# Patient Record
Sex: Female | Born: 1995 | Hispanic: Yes | Marital: Single | Smoking: Never smoker
Health system: Southern US, Community
[De-identification: ages and names within clinical notes are randomized; demographics above are authoritative.]

## PROBLEM LIST (undated history)

## (undated) DIAGNOSIS — L309 Dermatitis, unspecified: Secondary | ICD-10-CM

## (undated) DIAGNOSIS — T7840XA Allergy, unspecified, initial encounter: Secondary | ICD-10-CM

## (undated) DIAGNOSIS — G8929 Other chronic pain: Secondary | ICD-10-CM

## (undated) DIAGNOSIS — M25511 Pain in right shoulder: Secondary | ICD-10-CM

## (undated) DIAGNOSIS — M549 Dorsalgia, unspecified: Secondary | ICD-10-CM

## (undated) DIAGNOSIS — Z9109 Other allergy status, other than to drugs and biological substances: Secondary | ICD-10-CM

## (undated) HISTORY — DX: Pain in right shoulder: M25.511

## (undated) HISTORY — DX: Other chronic pain: G89.29

## (undated) HISTORY — DX: Allergy, unspecified, initial encounter: T78.40XA

---
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
## 2016-01-08 ENCOUNTER — Encounter: Payer: Medicaid Other | Admitting: Obstetrics and Gynecology

## 2016-01-09 ENCOUNTER — Encounter: Payer: Self-pay | Admitting: Obstetrics and Gynecology

## 2016-01-09 NOTE — Progress Notes (Signed)
Patient did not keep 01/08/2016 GYN visit  Cornelia Copaharlie Danicka Hourihan, Jr MD Attending Center for [facility](Faculty Practice)

## 2016-01-26 ENCOUNTER — Encounter (HOSPITAL_COMMUNITY): Payer: Self-pay | Admitting: Emergency Medicine

## 2016-01-26 ENCOUNTER — Emergency Department (HOSPITAL_COMMUNITY)
Admission: EM | Admit: 2016-01-26 | Discharge: 2016-01-26 | Disposition: A | Discharge status: Home / Self Care | Payer: Medicaid Other | Attending: Emergency Medicine | Admitting: Emergency Medicine

## 2016-01-26 DIAGNOSIS — L309 Dermatitis, unspecified: Secondary | ICD-10-CM | POA: Diagnosis not present

## 2016-01-26 DIAGNOSIS — R21 Rash and other nonspecific skin eruption: Secondary | ICD-10-CM | POA: Diagnosis present

## 2016-01-26 DIAGNOSIS — Z79899 Other long term (current) drug therapy: Secondary | ICD-10-CM | POA: Diagnosis not present

## 2016-01-26 HISTORY — DX: Dermatitis, unspecified: L30.9

## 2016-01-26 MED ORDER — PREDNISONE 10 MG PO TABS: 20.0000 mg | ORAL_TABLET | Freq: Two times a day (BID) | ORAL | 0 refills | Status: DC

## 2016-01-26 MED ORDER — PREDNISONE 20 MG PO TABS
60.0000 mg | ORAL_TABLET | Freq: Once | ORAL | Status: AC
  Administered 2016-01-26: 60 mg via ORAL
  Filled 2016-01-26: qty 3

## 2016-01-26 NOTE — ED Triage Notes (Signed)
Patient states that at work earlier she got red on face, neck, scalp and arms that got really "itchy".  Patient denies any new lotions, soaps, shampoos, deodarants.

## 2016-01-26 NOTE — ED Notes (Signed)
Pt reports hx of eczema worse with change in foods. Pt reports usually happens with large intake of coffee. Reports two cups of coffee yesterday and "then this happened." Pt reports onset 1400 today. Denies SOB, speaking full sentences, lungs sounds clear.

## 2016-01-26 NOTE — Discharge Instructions (Signed)
Use the prednisone as prescribed.  Also, for itching, use Benadryl 4 times a day and Pepcid twice a day.  Follow-up with your primary care doctor for checkup. If not better in 3 or 4 days.

## 2016-01-26 NOTE — ED Provider Notes (Signed)
[facility]-EMERGENCY DEPT Provider Note   CSN: [id number] Arrival date & time: 01/26/16  1746     History   Chief Complaint Chief Complaint  Patient presents with  . Rash    HPI Kristen Henderson is a 20 y.o. female. She complains of itchy rash typical of her usual outbreaks of eczema, face, both arms and right hand. It got suddenly worse today, and she suspects that she ate something that caused it. She denies shortness of breath, fever, chills, chest pain, cough, weakness or dizziness. She tried Benadryl today, without relief so came here. There are no other known modifying factors.  HPI  Past Medical History:  Diagnosis Date  . Back pain   . Eczema   . Environmental allergies     There are no active problems to display for this patient.   History reviewed. No pertinent surgical history.  OB History    No data available       Home Medications    Prior to Admission medications   Medication Sig Start Date End Date Taking? Authorizing Provider  acetaminophen (TYLENOL) 500 MG tablet Take 1,000 mg by mouth every 6 (six) hours as needed for moderate pain or headache.    Historical Provider, MD  benzonatate (TESSALON) 100 MG capsule Take 1 capsule (100 mg total) by mouth 3 (three) times daily as needed for cough. 06/28/15   Everlene FarrierWilliam Dansie, PA-C  CALCIUM PO Take 1 tablet by mouth daily.    Historical Provider, MD  cetirizine (ZYRTEC ALLERGY) 10 MG tablet Take 1 tablet (10 mg total) by mouth daily. 06/28/15   Everlene FarrierWilliam Dansie, PA-C  diphenhydrAMINE (BENADRYL) 25 MG tablet Take 1 tablet (25 mg total) by mouth every 6 (six) hours as needed for itching (Rash). 06/07/14   Antony MaduraKelly Humes, PA-C  famotidine (PEPCID) 20 MG tablet Take 1 tablet (20 mg total) by mouth 2 (two) times daily. 06/07/14   Antony MaduraKelly Humes, PA-C  Multiple Vitamin (MULTIVITAMIN WITH MINERALS) TABS tablet Take 2 tablets by mouth daily.    Historical Provider, MD  naproxen (NAPROSYN) 250 MG tablet Take 1 tablet (250 mg  total) by mouth 2 (two) times daily with a meal. 06/28/15   Everlene FarrierWilliam Dansie, PA-C    Family History No family history on file.  Social History Social History  Substance Use Topics  . Smoking status: Never Smoker  . Smokeless tobacco: Never Used  . Alcohol use No     Allergies   Review of patient's allergies indicates no known allergies.   Review of Systems Review of Systems  All other systems reviewed and are negative.    Physical Exam Updated Vital Signs BP 112/61 (BP Location: Left Arm)   Pulse 79   Temp 98.3 F (36.8 C) (Oral)   Resp 17   SpO2 100%   Physical Exam  Constitutional: She is oriented to person, place, and time. She appears well-developed and well-nourished.  HENT:  Head: Normocephalic and atraumatic.  Eyes: Conjunctivae and EOM are normal. Pupils are equal, round, and reactive to light.  Neck: Normal range of motion and phonation normal. Neck supple.  Cardiovascular: Normal rate.   Pulmonary/Chest: Effort normal. She exhibits no tenderness.  Musculoskeletal: Normal range of motion.  Neurological: She is alert and oriented to person, place, and time. She exhibits normal muscle tone.  Skin: Skin is warm and dry. There is erythema (Scattered red, flat areas of bilateral cheeks, anterior neck, and an area, plaque-like of the right dorsal hand).  Psychiatric: She  has a normal mood and affect. Her behavior is normal. Judgment and thought content normal.  Nursing note and vitals reviewed.    ED Treatments / Results  Labs (all labs ordered are listed, but only abnormal results are displayed) Labs Reviewed - No data to display  EKG  EKG Interpretation None       Radiology No results found.  Procedures Procedures (including critical care time)  Medications Ordered in ED Medications - No data to display   Initial Impression / Assessment and Plan / ED Course  I have reviewed the triage vital signs and the nursing notes.  Pertinent labs &  imaging results that were available during my care of the patient were reviewed by me and considered in my medical decision making (see chart for details).  Clinical Course    Medications  predniSONE (DELTASONE) tablet 60 mg (not administered)    Patient Vitals for the past 24 hrs:  BP Temp Temp src Pulse Resp SpO2  01/26/16 1758 112/61 98.3 F (36.8 C) Oral 79 17 100 %    6:19 PM Reevaluation with update and discussion. After initial assessment and treatment, an updated evaluation reveals No change in clinical status. Findings discussed with patient. No questions answered. Kristen Henderson    Final Clinical Impressions(s) / ED Diagnoses   Final diagnoses:  Eczema, unspecified type    Nonspecific rash, likely eczema. Doubt anaphylaxis, or serious allergic reaction  Nursing Notes Reviewed/ Care Coordinated Applicable Imaging Reviewed Interpretation of Laboratory Data incorporated into ED treatment  The patient appears reasonably screened and/or stabilized for discharge and I doubt any other medical condition or other [facility] requiring further screening, evaluation, or treatment in the ED at this time prior to discharge.  Plan: Home Medications- continue; Home Treatments- rest; return here if the recommended treatment, does not improve the symptoms; Recommended follow up- PCP prn    New Prescriptions New Prescriptions   No medications on file     Mancel Bale, MD 01/26/16 1824

## 2016-03-16 ENCOUNTER — Encounter: Payer: Self-pay | Admitting: Allergy and Immunology

## 2016-03-16 ENCOUNTER — Ambulatory Visit (INDEPENDENT_AMBULATORY_CARE_PROVIDER_SITE_OTHER): Payer: Medicaid Other | Admitting: Allergy and Immunology

## 2016-03-16 VITALS — BP 110/72 | HR 72 | Temp 97.7°F | Resp 18 | Ht 63.0 in | Wt 165.0 lb

## 2016-03-16 DIAGNOSIS — J3089 Other allergic rhinitis: Secondary | ICD-10-CM | POA: Diagnosis not present

## 2016-03-16 DIAGNOSIS — L2089 Other atopic dermatitis: Secondary | ICD-10-CM | POA: Diagnosis not present

## 2016-03-16 MED ORDER — METHYLPREDNISOLONE ACETATE 80 MG/ML IJ SUSP
80.0000 mg | Freq: Once | INTRAMUSCULAR | Status: AC
  Administered 2016-03-16: 80 mg via INTRAMUSCULAR

## 2016-03-16 MED ORDER — MONTELUKAST SODIUM 10 MG PO TABS: 10.0000 mg | ORAL_TABLET | Freq: Every day | ORAL | 5 refills | Status: DC

## 2016-03-16 MED ORDER — FLUTICASONE PROPIONATE 50 MCG/ACT NA SUSP: 1.0000 | Freq: Every day | NASAL | 5 refills | Status: DC

## 2016-03-16 MED ORDER — PIMECROLIMUS 1 % EX CREA: TOPICAL_CREAM | Freq: Two times a day (BID) | CUTANEOUS | 5 refills | Status: DC

## 2016-03-16 MED ORDER — MOMETASONE FUROATE 0.1 % EX OINT: TOPICAL_OINTMENT | Freq: Every day | CUTANEOUS | 0 refills | Status: DC

## 2016-03-16 NOTE — Progress Notes (Signed)
Dear Dr. Sabino Dick,  Thank you for referring Kristen Henderson to the [facility] [place] on 03/16/2016.   Below is a summation of this patient's evaluation and recommendations.  Thank you for your referral. I will keep you informed about this patient's response to treatment.   If you have any questions please do not hesitate to contact me.   Sincerely,  Jessica Priest, MD [facility]   ______________________________________________________________________    NEW PATIENT NOTE  Referring Provider: Christel Mormon, MD Primary Provider: No PCP Per Patient Date of office visit: 03/16/2016    Subjective:   Chief Complaint:  Kristen Henderson (DOB: 1995-12-13) is a 20 y.o. female who presents to the clinic on 03/16/2016 with a chief complaint of Allergic Reaction; Angioedema (eye swelling); Urticaria; and Eczema .     HPI: Kristen Henderson presents to this clinic in evaluation of atopic disease involving 2 main organ systems.  Kristen Henderson has had lifelong eczema that has been progressive over the course of the past several years. Her eczema usually involves her extremities and her neck and her face. Kristen Henderson has seen a dermatologist and has had a check blood which apparently has been "normal". Kristen Henderson has been treated with multiple topical agents including topical steroids and topical Eucrisa which are ineffective in alleviating her situation. Kristen Henderson has required two systemic steroids in the past several years. There is no obvious provoking factor giving rise to her eczema. Since June Kristen Henderson's had a very significant involvement of her face with periorbital swelling and severe scaling of the skin of her face. Once again there is no obvious provoking factor giving rise to this increased facial activity.  Past Medical History:  Diagnosis Date  . Back pain   . Eczema   . Environmental allergies     No past surgical history  on file.    Medication List      diphenhydrAMINE 25 MG tablet Commonly known as:  BENADRYL Take 1 tablet (25 mg total) by mouth every 6 (six) hours as needed for itching (Rash).   loratadine 10 MG tablet Commonly known as:  CLARITIN Take 10 mg by mouth daily.       No Known Allergies  Review of systems negative except as noted in HPI / PMHx or noted below:  Review of Systems  Constitutional: Negative.   HENT: Negative.   Eyes: Negative.   Respiratory: Negative.   Cardiovascular: Negative.   Gastrointestinal: Negative.   Genitourinary: Negative.   Musculoskeletal: Negative.   Skin: Negative.   Neurological: Negative.   Endo/Heme/Allergies: Negative.   Psychiatric/Behavioral: Negative.     Family History  Problem Relation Age of Onset  . Allergic rhinitis Brother   . Asthma Brother   . Eczema Brother   . Diabetes Paternal Grandmother     Social History   Social History  . Marital status: Single    Spouse name: N/A  . Number of children: N/A  . Years of education: N/A   Occupational History  . Not on file.   Social History Main Topics  . Smoking status: Never Smoker  . Smokeless tobacco: Never Used  . Alcohol use No  . Drug use: No  . Sexual activity: Not on file   Other Topics Concern  . Not on file   Social History Narrative  . No narrative on file    Environmental and Social history  Lives in  a house with a dry environment, a bird located inside the household, carpeting in the bedroom, no plastic on the bed or pillow, and no smokers located inside the household. 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  Allergic shiners, rubbing of skin and nose  HENT:  Head: Normocephalic. Head is without right periorbital erythema and  without left periorbital erythema.  Right Ear: Tympanic membrane, external ear and ear canal normal.  Left Ear: Tympanic membrane, external ear and ear canal normal.  Nose: Mucosal edema present. No rhinorrhea.  Mouth/Throat: Oropharynx is clear and moist and mucous membranes are normal. No oropharyngeal exudate.  Eyes: Conjunctivae and lids are normal. Pupils are equal, round, and reactive to light.  Neck: Trachea normal. No tracheal deviation present. No thyromegaly present.  Cardiovascular: Normal rate, regular rhythm, S1 normal, S2 normal and normal heart sounds.   No murmur heard. Pulmonary/Chest: Effort normal. No stridor. No tachypnea. No respiratory distress. Kristen Henderson has no wheezes. Kristen Henderson has no rales. Kristen Henderson exhibits no tenderness.  Abdominal: Soft. Kristen Henderson exhibits no distension and no mass. There is no hepatosplenomegaly. There is no tenderness. There is no rebound and no guarding.  Musculoskeletal: Kristen Henderson exhibits no edema or tenderness.  Lymphadenopathy:       Head (right side): No tonsillar adenopathy present.       Head (left side): No tonsillar adenopathy present.    Kristen Henderson has no cervical adenopathy.    Kristen Henderson has no axillary adenopathy.  Neurological: Kristen Henderson is alert. Gait normal.  Skin: Rash (Large lichenified erythematous plaques involving extremities especially antecubital fossa bilaterally, hands, posterior neck, and face) noted. Kristen Henderson is not diaphoretic. No erythema. No pallor. Nails show no clubbing.  Psychiatric: Mood and affect normal.    Diagnostics: Allergy skin tests were not performed secondary to the recent administration of an antihistamine.   Results of blood tests forwarded by Dr. Sabino Dick dated 02/05/2016 refers to an IgE level of 597 KU/ML with IgE antibodies directed against fungi, dust mite, cockroach, and slight hypersensitivity directed against dog and shrimp.  Assessment and Plan:    1. Other atopic dermatitis   2. Other allergic rhinitis     1. Return to clinic in 2  weeks without antihistamine use for skin testing  2. Depo-Medrol 80 IM delivered in clinic today  3. Use a combination of Elidel followed by mometasone 0.1% ointment applied to eczema twice a day. Best if applied after water exposure.  4. Use Flonase one spray each nostril one time per day  5. Use montelukast 10 mg tablet 1 time per day  6. Can continue Claritin and Benadryl as needed  7. Submit for dupilumab administration  8. Obtain a flu vaccine this fall.  Kristen Henderson has failed medical therapy regarding her atopic disease and I will now see if we can get her to start dupilumab to treat her atopic dermatitis. If Kristen Henderson has a good response to dupilumab and Kristen Henderson should have no activity of her skin the Kristen Henderson may not require any medicines other than the administration of this injection every month. As well, this will probably take care of her allergic rhinitis. For today I will give her systemic steroid and have her use a combination of a calcineurin inhibitor and  a topical steroid as noted above. Kristen Henderson will probably need to use a little topical steroid on her face until things get under control and then Kristen Henderson can just rely on the use of her calcineurin inhibitor. We'll get her back in this clinic in a few weeks to define her aeroallergens hypersensitivity profile and provide her with allergen avoidance measures and possibly consider a course immunotherapy.  Jessica Priest, MD [facility] [place]

## 2016-03-16 NOTE — Patient Instructions (Addendum)
  1. Return to clinic in 2 weeks without antihistamine use for skin testing  2. Depo-Medrol 80 IM delivered in clinic today  3. Use a combination of Elidel followed by mometasone 0.1% ointment applied to eczema twice a day. Best if applied after water exposure.  4. Use Flonase one spray each nostril one time per day  5. Use montelukast 10 mg tablet 1 time per day  6. Can continue Claritin and Benadryl as needed  7. Submit for dupilumab administration  8. Obtain a flu vaccine this fall.

## 2016-04-27 ENCOUNTER — Encounter (INDEPENDENT_AMBULATORY_CARE_PROVIDER_SITE_OTHER): Payer: Self-pay

## 2016-04-27 ENCOUNTER — Encounter: Payer: Self-pay | Admitting: Allergy and Immunology

## 2016-04-27 ENCOUNTER — Ambulatory Visit (INDEPENDENT_AMBULATORY_CARE_PROVIDER_SITE_OTHER): Payer: Medicaid Other | Admitting: Allergy and Immunology

## 2016-04-27 VITALS — BP 108/82 | HR 64 | Resp 16

## 2016-04-27 DIAGNOSIS — J3089 Other allergic rhinitis: Secondary | ICD-10-CM | POA: Diagnosis not present

## 2016-04-27 DIAGNOSIS — L2089 Other atopic dermatitis: Secondary | ICD-10-CM

## 2016-04-27 NOTE — Progress Notes (Signed)
Follow-up Note  Referring Provider: No ref. provider found Primary Provider: Christel MormonOCCARO,PETER J, MD Date of Office Visit: 04/27/2016  Subjective:   Kristen Henderson (DOB: 08/13/1995) is a 21 y.o. female who [facility] on 04[facility]27/2016 in re-evaluation of the following:  HPI: Kristen Henderson returns to this clinic in reevaluation of her severe atopic dermatitis and allergic rhinitis. When I initially saw her in his clinic on 03/16/2016 we gave her systemic steroid and asked her to use topical anti-inflammatory agents for her skin and therapy directed against her atopic upper airway disease.  As expected she did quite well with the administration of systemic steroids and her other therapy but has slowly been redeveloping significant issues with her eczema as time his move forward.   There was some confusion regarding the logistics of obtaining her dupilumab. She had been living in [place] for a period in time and although the medication is available for her to start she has not yet contacted the company for delivery of this biological agent.  Her nose has not really been causing her much of a problem at all.  Allergies as of 04/27/2016   No Known Allergies     Medication List      diphenhydrAMINE 25 MG tablet Commonly known as:  BENADRYL Take 1 tablet (25 mg total) by mouth every 6 (six) hours as needed for itching (Rash).   fluticasone 50 MCG/ACT nasal spray Commonly known as:  FLONASE Place 1 spray into both nostrils daily.   loratadine 10 MG tablet Commonly known as:  CLARITIN Take 10 mg by mouth daily.   mometasone 0.1 % ointment Commonly known as:  ELOCON Apply topically daily.   montelukast 10 MG tablet Commonly known as:  SINGULAIR Take 1 tablet (10 mg total) by mouth at bedtime.   pimecrolimus 1 % cream Commonly known as:  ELIDEL Apply topically 2 (two) times daily.       Past Medical History:  Diagnosis Date  . Back pain   .  Eczema   . Environmental allergies     History reviewed. No pertinent surgical history.  Review of systems negative except as noted in HPI / PMHx or noted below:  Review of Systems  Constitutional: Negative.   HENT: Negative.   Eyes: Negative.   Respiratory: Negative.   Cardiovascular: Negative.   Gastrointestinal: Negative.   Genitourinary: Negative.   Musculoskeletal: Negative.   Skin: Negative.   Neurological: Negative.   Endo/Heme/Allergies: Negative.   Psychiatric/Behavioral: Negative.      Objective:   Vitals:   04/27/16 1340  BP: 108/82  Pulse: 64  Resp: 16          Physical Exam  Skin: Rash (multiple scaly lichenified erythematous plaques involving posterior neck, antecubital fossa, dorsal surfaces of hands, periorbital and perioral surfaces.) noted.    Diagnostics: Allergy skin testing was attempted but she had a negative histamine control most likely secondary to antihistamine use several days ago.  Assessment and Plan:   1. Other atopic dermatitis   2. Other allergic rhinitis     1. Allergen avoidance measures  2. Use a combination of Elidel followed by mometasone 0.1% ointment applied to eczema twice a day. Best if applied after water exposure.  3. Use Flonase one spray each nostril one time per day  4. Use montelukast 10 mg tablet 1 time per day  5. Can continue Claritin and Benadryl as needed  6. Contact company for dupilumab  delivery.  7. Return in 12 weeks  I think that Maitri's going to do much better when she starts on her dupilumab. We'll give her a total of 12 weeks of therapy and make an assessment of her response at that point in time. She will also perform allergen avoidance measures based upon her previous skin test results and continue to use anti-inflammatory agents for both her skin and airway at this point in time.  Laurette Schimke, MD [facility]

## 2016-04-27 NOTE — Patient Instructions (Addendum)
  1. Allergen avoidance measures  2. Use a combination of Elidel followed by mometasone 0.1% ointment applied to eczema twice a day. Best if applied after water exposure.  3. Use Flonase one spray each nostril one time per day  4. Use montelukast 10 mg tablet 1 time per day  5. Can continue Claritin and Benadryl as needed  6. Contact company for dupilumab delivery.  7. Return in 12 weeks

## 2016-04-30 ENCOUNTER — Ambulatory Visit (INDEPENDENT_AMBULATORY_CARE_PROVIDER_SITE_OTHER): Payer: Medicaid Other

## 2016-04-30 DIAGNOSIS — L2089 Other atopic dermatitis: Secondary | ICD-10-CM

## 2016-04-30 DIAGNOSIS — L209 Atopic dermatitis, unspecified: Secondary | ICD-10-CM | POA: Diagnosis not present

## 2016-04-30 NOTE — Progress Notes (Signed)
The patient and her brother were both taught on how to give dupilumab injections in her abdomen. The patient's brother showed proficiency on how to give the injection in her abdomen. The patient was advised that she will receive one injection after the loading dose. Advised pt to wait for 1 hour. Pt left right after injection.  Lot: [id number] Exp: 05/2017  Injection site: LLQ and RLQ of the abdomen.

## 2016-07-20 ENCOUNTER — Ambulatory Visit: Payer: Medicaid Other | Admitting: Allergy and Immunology

## 2016-10-27 IMAGING — CR DG CHEST 2V
2 series · 2 of 2 positions shown · non-contrast
Comparison: 12/05/2012

CLINICAL DATA: Productive cough, chills, fevers, headache for 4
days.

EXAM:
CHEST  2 VIEW

[w chest pa]
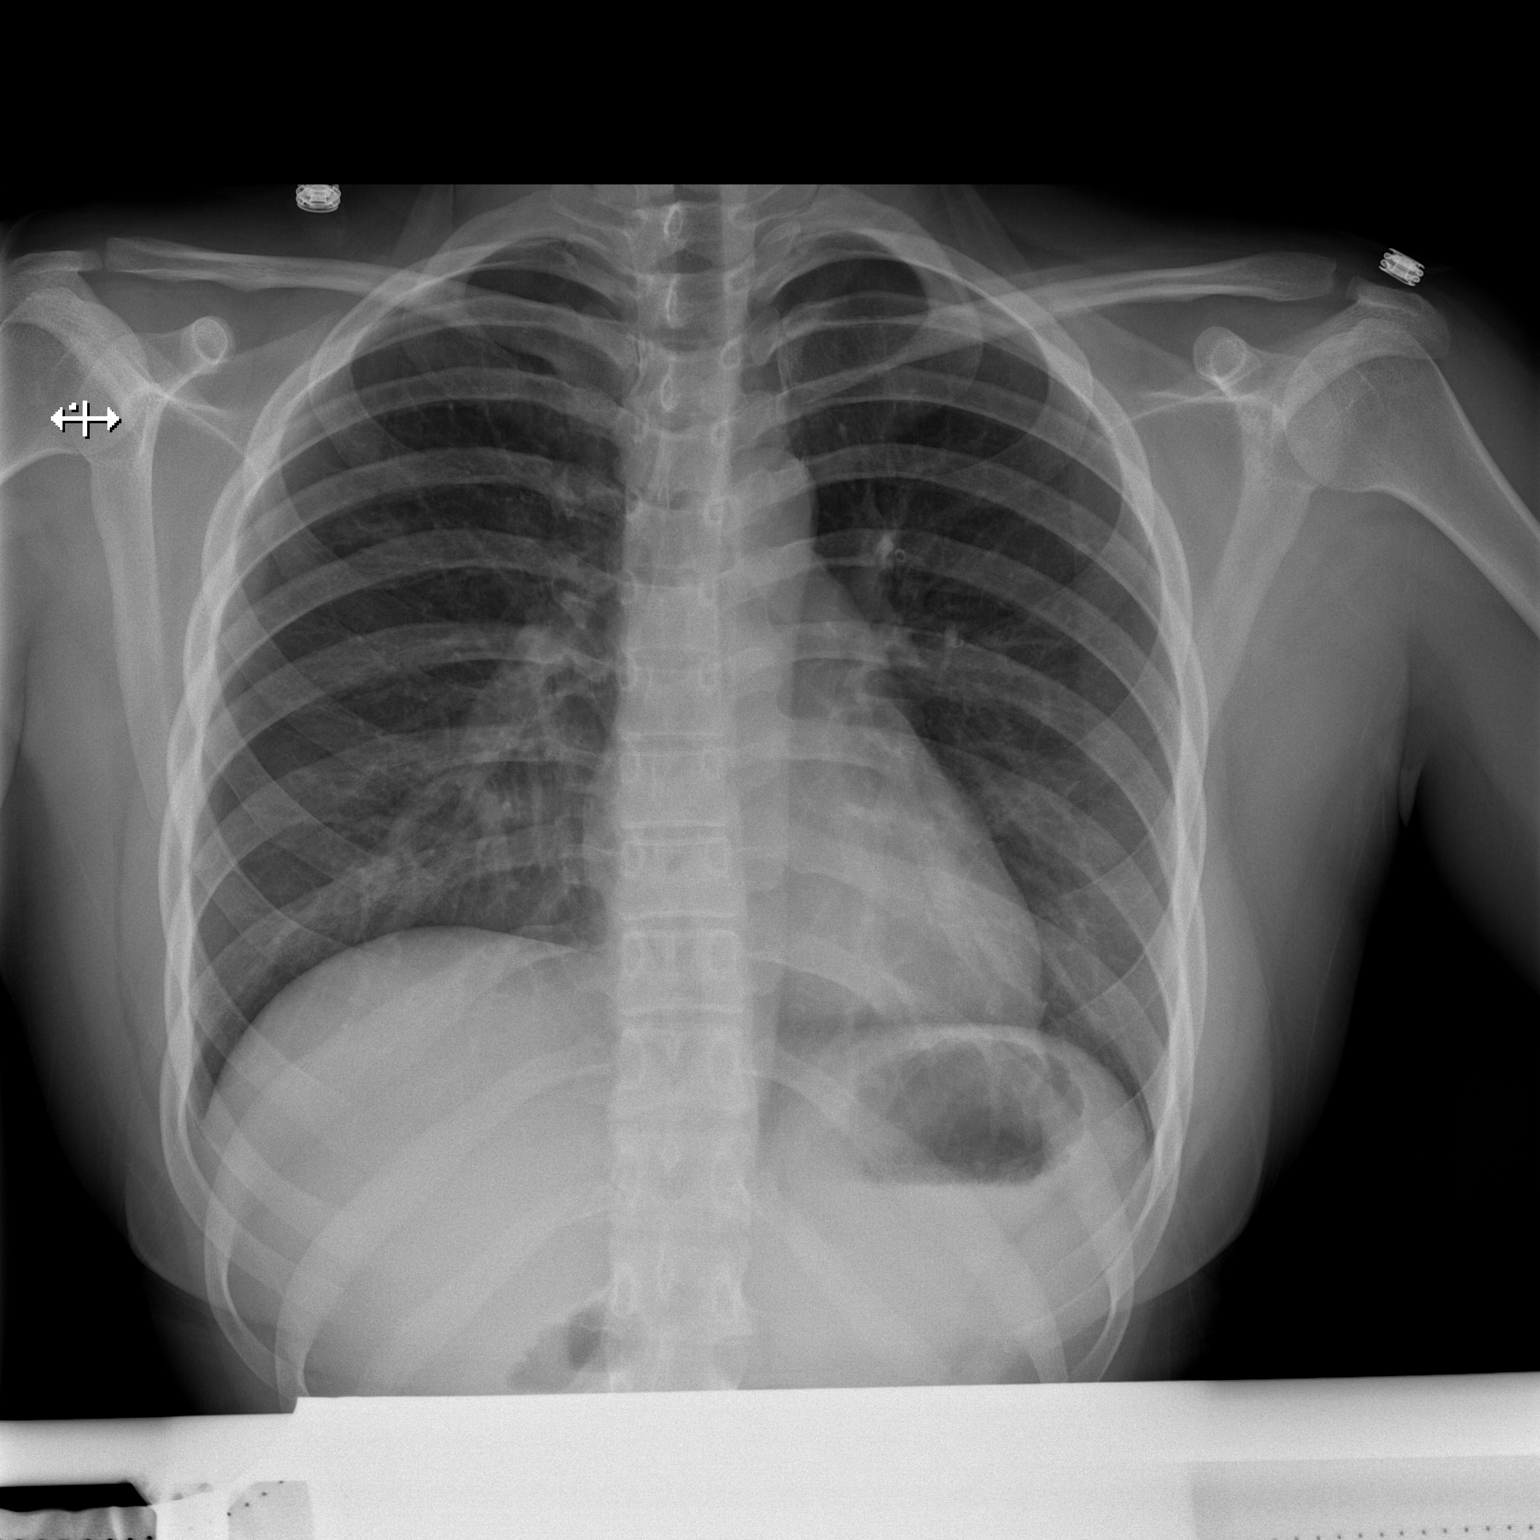

[w chest lat]
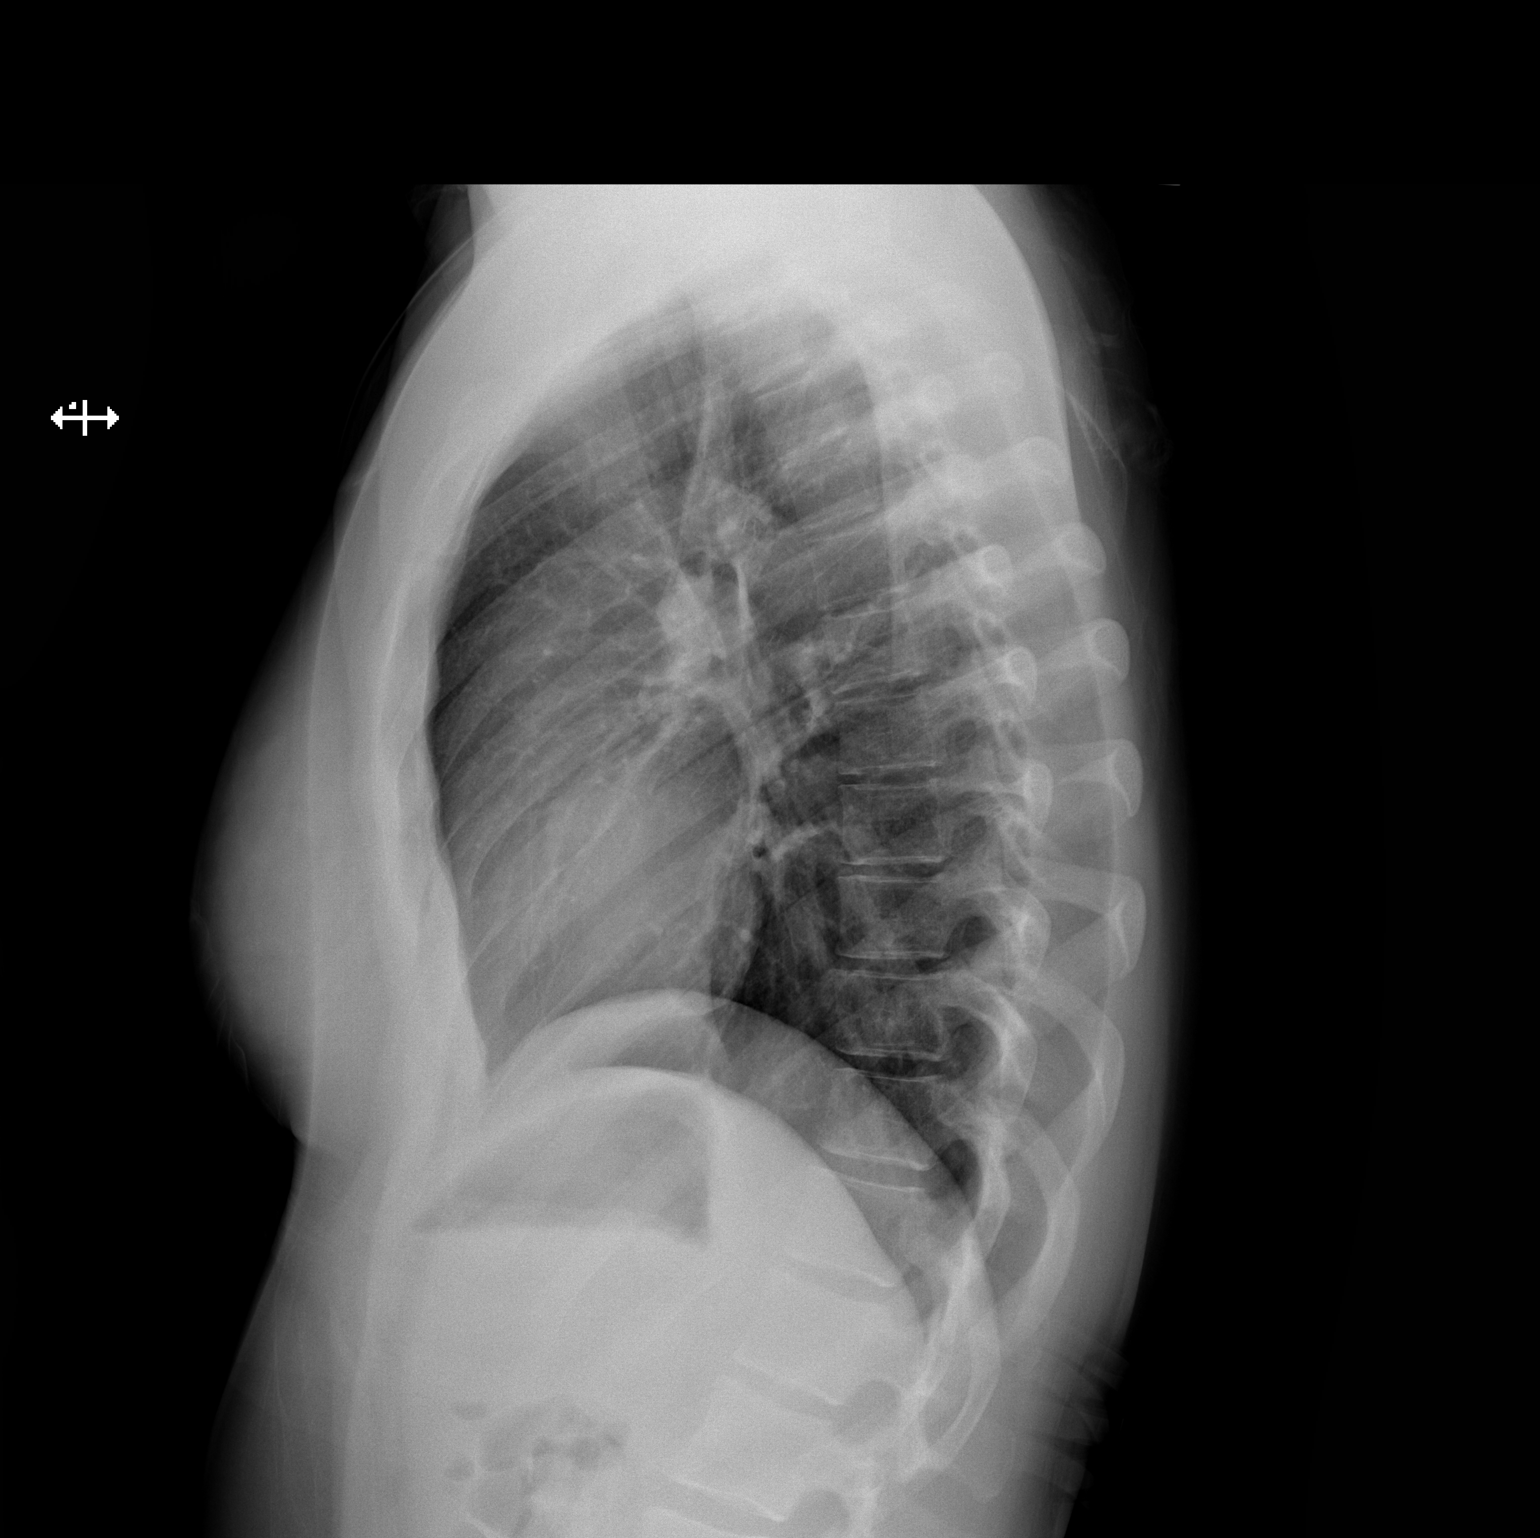

[2 of 2 positions shown; findings below may reference images not displayed]

FINDINGS: The cardiomediastinal contours are normal. The lungs are clear.
Pulmonary vasculature is normal. No consolidation, pleural effusion,
or pneumothorax. No acute osseous abnormalities are seen.
IMPRESSION: No acute pulmonary process.

## 2017-06-09 DIAGNOSIS — R112 Nausea with vomiting, unspecified: Secondary | ICD-10-CM | POA: Diagnosis not present

## 2017-06-09 DIAGNOSIS — B349 Viral infection, unspecified: Secondary | ICD-10-CM | POA: Diagnosis not present

## 2017-07-29 ENCOUNTER — Ambulatory Visit (HOSPITAL_BASED_OUTPATIENT_CLINIC_OR_DEPARTMENT_OTHER)
Admission: RE | Admit: 2017-07-29 | Discharge: 2017-07-29 | Disposition: A | Discharge status: Home / Self Care | Payer: BLUE CROSS/BLUE SHIELD | Source: Ambulatory Visit | Attending: Medical | Admitting: Medical

## 2017-07-29 ENCOUNTER — Encounter: Payer: Self-pay | Admitting: Medical

## 2017-07-29 ENCOUNTER — Ambulatory Visit: Payer: BLUE CROSS/BLUE SHIELD | Admitting: Medical

## 2017-07-29 ENCOUNTER — Telehealth: Payer: Self-pay

## 2017-07-29 VITALS — BP 105/57 | HR 63 | Temp 98.1°F | Resp 16 | Ht 63.0 in | Wt 169.2 lb

## 2017-07-29 DIAGNOSIS — J301 Allergic rhinitis due to pollen: Secondary | ICD-10-CM

## 2017-07-29 DIAGNOSIS — M26621 Arthralgia of right temporomandibular joint: Secondary | ICD-10-CM

## 2017-07-29 DIAGNOSIS — L309 Dermatitis, unspecified: Secondary | ICD-10-CM | POA: Diagnosis not present

## 2017-07-29 DIAGNOSIS — G8929 Other chronic pain: Secondary | ICD-10-CM

## 2017-07-29 DIAGNOSIS — M25511 Pain in right shoulder: Secondary | ICD-10-CM

## 2017-07-29 MED ORDER — PIMECROLIMUS 1 % EX CREA: TOPICAL_CREAM | Freq: Two times a day (BID) | CUTANEOUS | 0 refills | Status: DC

## 2017-07-29 MED ORDER — PREDNISONE 10 MG PO TABS: ORAL_TABLET | ORAL | 0 refills | Status: DC

## 2017-07-29 MED ORDER — MONTELUKAST SODIUM 10 MG PO TABS: 10.0000 mg | ORAL_TABLET | Freq: Every day | ORAL | 3 refills | Status: DC

## 2017-07-29 MED ORDER — DICLOFENAC SODIUM 75 MG PO TBEC
75.0000 mg | DELAYED_RELEASE_TABLET | Freq: Two times a day (BID) | ORAL | 0 refills | Status: DC
Start: 2017-07-29 — End: 2017-09-26

## 2017-07-29 MED ORDER — FLUTICASONE PROPIONATE 50 MCG/ACT NA SUSP: 2.0000 | Freq: Every day | NASAL | 11 refills | Status: DC

## 2017-07-29 MED ORDER — LEVOCETIRIZINE DIHYDROCHLORIDE 5 MG PO TABS: 5.0000 mg | ORAL_TABLET | Freq: Every evening | ORAL | 3 refills | Status: DC

## 2017-07-29 NOTE — Patient Instructions (Signed)
For seasonal allergies. Rx xyzal, flonase and singulair. 5 day taper dose prednisone due to severity.  For eczema use aveeno twice daily and dove moisturizing soap.  Will try elidel cream twice daily. But not to get place in eye. Caution on upper eyelid application. Do recommend dermatologist referral.   For shoulder pain, start diclofenac in 6 days. After prednisone use. Refer to sport [facility]. Get xray today.  Conservative measure tmj. Recommend see dentist. When get xrays may get view of tm joint.  Follow up in 2 weeks or as needed
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
## 2017-07-29 NOTE — Telephone Encounter (Signed)
PA initiated via Covermymeds; KEY: [id number]. Received real time approval.   Effective from 07/29/2017 through 07/28/2018.

## 2017-08-04 ENCOUNTER — Ambulatory Visit: Payer: BLUE CROSS/BLUE SHIELD | Admitting: Family Medicine

## 2017-08-09 ENCOUNTER — Telehealth: Payer: Self-pay | Admitting: Medical

## 2017-08-09 NOTE — Telephone Encounter (Signed)
If Dr. Carmelia RollerWendling willing to do removal procedure then ok. Can you run it by him.

## 2017-08-09 NOTE — Telephone Encounter (Signed)
Copied from CRM [phone]#[id number]. Topic: Inquiry >> Aug 09, 2017  1:49 PM Alexander BergeronBarksdale, Harvey B wrote: Reason for CRM: pt called and asked about getting a pregnancy implant removed in her arm, pt is needing to have this request to be reviewed by Dr. Carmelia RollerWendling to see if he can do it or to have the pt to go back to the original facility [facility] the [facility] and have them do it, contact pt to advise

## 2017-08-09 NOTE — Telephone Encounter (Signed)
Dr Carmelia RollerWendling-- please see below and advise?

## 2017-08-10 NOTE — Telephone Encounter (Signed)
I can do it. TY.

## 2017-08-10 NOTE — Telephone Encounter (Signed)
Needs to be scheduled with Dr. Carmelia RollerWendling for removal of nexplanon

## 2017-08-11 NOTE — Telephone Encounter (Signed)
Called pt to try and schedule an appt for the nexplanon removal. Pt does not have a voicemail set up, so I was unable to leave a message.

## 2017-08-12 ENCOUNTER — Ambulatory Visit (INDEPENDENT_AMBULATORY_CARE_PROVIDER_SITE_OTHER): Payer: BLUE CROSS/BLUE SHIELD | Admitting: Family Medicine

## 2017-08-12 ENCOUNTER — Encounter: Payer: Self-pay | Admitting: Medical

## 2017-08-12 ENCOUNTER — Encounter: Payer: Self-pay | Admitting: Family Medicine

## 2017-08-12 VITALS — BP 108/60 | HR 65 | Temp 98.9°F | Ht 63.0 in | Wt 170.2 lb

## 2017-08-12 DIAGNOSIS — Z3046 Encounter for surveillance of implantable subdermal contraceptive: Secondary | ICD-10-CM

## 2017-08-12 NOTE — Progress Notes (Signed)
Procedure note; Nexplanon removal Informed consent obtained. The patient was placed in the supine position with her left arm abducted and external rotated, exposing the medial surface of her arm. The Nexplanon was palpated and the distal edge was elevated with pressure to the proximal end. This area was then cleaned with an alcohol swab. Approximately 1 cc of 1% lidocaine without epinephrine was injected deep to the palpated trocar. While pressure was applied to the proximal side of the trocar, an 11-blade scalpel was used to make a small incision over the anesthetized area, parallel to the trocar. The trocar was visualized and scar tissue was dissected around it. It was grasped with a straight hemostat and removed.  The area was wrapped with a pressure bandage. Adequate hemostasis was obtained. The patient tolerated the procedure well. There were no complications noted.  Aftercare instructions verbalized and writtend own. F/u prn. Pt voiced understanding and agreement to the plan.  Kristen Henderson 4:30 PM 08/12/17

## 2017-08-12 NOTE — Patient Instructions (Signed)
Do not shower for the rest of the day. When you do wash it, use only soap and water. Do not vigorously scrub. Keep the area clean and dry.   Things to look out for: increasing pain not relieved by ibuprofen/acetaminophen, fevers, spreading redness, drainage of pus, or foul odor.

## 2017-08-12 NOTE — Progress Notes (Signed)
Pre visit review using our clinic review tool, if applicable. No additional management support is needed unless otherwise documented below in the visit note. 

## 2017-09-26 ENCOUNTER — Encounter: Payer: Self-pay | Admitting: Medical

## 2017-09-26 ENCOUNTER — Ambulatory Visit (INDEPENDENT_AMBULATORY_CARE_PROVIDER_SITE_OTHER): Payer: BLUE CROSS/BLUE SHIELD | Admitting: Medical

## 2017-09-26 VITALS — BP 110/69 | HR 60 | Temp 98.0°F | Resp 16 | Ht 63.0 in | Wt 171.4 lb

## 2017-09-26 DIAGNOSIS — M26609 Unspecified temporomandibular joint disorder, unspecified side: Secondary | ICD-10-CM | POA: Diagnosis not present

## 2017-09-26 MED ORDER — LEVOCETIRIZINE DIHYDROCHLORIDE 5 MG PO TABS: 5.0000 mg | ORAL_TABLET | Freq: Every evening | ORAL | 3 refills | Status: DC

## 2017-09-26 MED ORDER — MONTELUKAST SODIUM 10 MG PO TABS: 10.0000 mg | ORAL_TABLET | Freq: Every day | ORAL | 3 refills | Status: DC

## 2017-09-26 MED ORDER — CYCLOBENZAPRINE HCL 10 MG PO TABS: 10.0000 mg | ORAL_TABLET | Freq: Three times a day (TID) | ORAL | 0 refills | Status: DC | PRN

## 2017-09-26 MED ORDER — DICLOFENAC SODIUM 75 MG PO TBEC: 75.0000 mg | DELAYED_RELEASE_TABLET | Freq: Two times a day (BID) | ORAL | 0 refills | Status: DC

## 2017-09-26 MED FILL — DICLOFENAC SOD EC 75 MG TAB: 75 | 15 days supply | Qty: 30 | Fill #0

## 2017-09-26 MED FILL — CYCLOBENZAPRINE HCL 10 MG T: 10 | 5 days supply | Qty: 15 | Fill #0

## 2017-09-26 NOTE — Patient Instructions (Addendum)
    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  I am prescribing diclofenac  NSAID and Flexeril muscle relaxant.  Regarding muscle relaxant use can use 1 tablet 3 times daily over the next 2 days.  Then on return to work Thursday just use 1 tablet at night.  Avoid any foods that require access chewing.  Reminder not to chew any gum or ice.  If your symptoms were to persist then might refer you to specialist.  Either ENT or oral surgeon.  Follow-up in 7 to 10 days or as needed.

## 2017-09-26 NOTE — Progress Notes (Signed)
Subjective:    Patient ID: Kristen Henderson, female    DOB: Jul 25, 1995, 22 y.o.   MRN: [id number]  HPI  Pt in for rt side jaw pain with severe tight sensation rt tmj area. She describes like jaw misaligned. Faint slight pop at times to left tmj region.  Symptoms just came on yesterday after waking from a nap.  Pt declines any recent severe stress. No jaw trauma or face trauma.  She does not history of probable tmj.  Pt does not chew ice or gum.   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  Pt wants to know is we can prescribe dipilumab injection.(she will get from allergist) I explained best for allergist to prescribe.   LMP- Sep 11, 2017.  Review of Systems  Constitutional: Negative for chills, fatigue and fever.  Respiratory: Negative for cough, chest tightness, shortness of breath and wheezing.   Cardiovascular: Negative for chest pain and palpitations.  Musculoskeletal: Negative for back pain.       Pain rt jaw/tmj area. Tight feel.  Some left tmj crepitus.  Neurological: Negative for dizziness, seizures, weakness and light-headedness.  Hematological: Negative for adenopathy. Does not bruise/bleed easily.  Psychiatric/Behavioral: Negative for behavioral problems and confusion.    Past Medical History:  Diagnosis Date  . Allergy   . Back pain   . Chronic right shoulder pain   . Eczema   . Eczema   . Environmental allergies      Social History   Socioeconomic History  . Marital status: Single    Spouse name: Not on file  . Number of children: Not on file  . Years of education: Not on file  . Highest education level: Not on file  Occupational History  . Not on file  Social Needs  . Financial resource strain: Not on file  . Food insecurity:    Worry: Not on file    Inability: Not on file  . Transportation needs:    Medical: Not on file    Non-medical: Not on file  Tobacco Use  . Smoking status: Never Smoker  . Smokeless tobacco:  Never Used  Substance and Sexual Activity  . Alcohol use: No  . Drug use: No  . Sexual activity: Not on file  Lifestyle  . Physical activity:    Days per week: Not on file    Minutes per session: Not on file  . Stress: Not on file  Relationships  . Social connections:    Talks on phone: Not on file    Gets together: Not on file    Attends religious service: Not on file    Active member of club or organization: Not on file    Attends meetings of clubs or organizations: Not on file    Relationship status: Not on file  . Intimate partner violence:    Fear of current or ex partner: Not on file    Emotionally abused: Not on file    Physically abused: Not on file    Forced sexual activity: Not on file  Other Topics Concern  . Not on file  Social History Narrative  . Not on file    No past surgical history on file.  Family History  Problem Relation Age of Onset  . Allergic rhinitis Brother   . Asthma Brother   . Eczema Brother   . Diabetes Paternal Grandmother     No Known Allergies  Current Outpatient Medications on  File Prior to Visit  Medication Sig Dispense Refill  . diclofenac (VOLTAREN) 75 MG EC tablet Take 1 tablet (75 mg total) by mouth 2 (two) times daily. 30 tablet 0  . fluticasone (FLONASE) 50 MCG/ACT nasal spray Place 2 sprays into both nostrils daily. 16 g 11  . loratadine (CLARITIN) 10 MG tablet Take 10 mg by mouth daily.    . pimecrolimus (ELIDEL) 1 % cream Apply topically 2 (two) times daily. 30 g 0   No current facility-administered medications on file prior to visit.     BP 110/69   Pulse 60   Temp 98 F (36.7 C) (Oral)   Resp 16   Ht 5\' 3"  (1.6 m)   Wt 171 lb 6.4 oz (77.7 kg)   SpO2 100%   BMI 30.36 kg/m      Objective:   Physical Exam  General  Mental Status - Alert. General Appearance - Well groomed. Not in acute distress.  Skin Rashes- No Rashes.  HEENT Head- Normal. Ear Auditory Canal - Left- Normal. Right - Normal.Tympanic  Membrane- Left- Normal. Right- Normal. Eye Sclera/Conjunctiva- Left- Normal. Right- Normal. Nose & Sinuses Nasal Mucosa- Left-  Boggy and Congested. Right-  Boggy and  Congested.Bilateral no maxillary and no frontal sinus pressure. Mouth & Throat Lips: Upper Lip- Normal: no dryness, cracking, pallor, cyanosis, or vesicular eruption. Lower Lip-Normal: no dryness, cracking, pallor, cyanosis or vesicular eruption. Buccal Mucosa- Bilateral- No Aphthous ulcers. Oropharynx- No Discharge or Erythema. Patient has difficulty fully opening her mouth due to tightness in both TMJ regions.  Right side feels more tight than the left per patient.  Both sides a little tender to palpation directly in the TM joint region.  Upon inspection of teeth they look normal.  Upon inspection I do not visualize any molar teeth eruption. Tonsils: Characteristics- Bilateral- No Erythema or Congestion. Size/Enlargement- Bilateral- No enlargement. Discharge- bilateral-None.     Neck Neck- Supple. No Masses.  No lymphadenopathy   Chest and Lung Exam Auscultation: Breath Sounds:-Clear even and unlabored.  Cardiovascular Auscultation:Rythm- Regular, rate and rhythm. Murmurs & Other Heart Sounds:Ausculatation of the heart reveal- No Murmurs.  Lymphatic Head & Neck General Head & Neck Lymphatics: Bilateral: Description- No Localized lymphadenopathy.       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  I am prescribing diclofenac  NSAID and Flexeril muscle relaxant.  Regarding muscle relaxant use can use 1 tablet 3 times daily over the next 2 days.  Then on  return to work Thursday just use 1 tablet at night.  Avoid any foods that require access chewing.  Reminder not to chew any gum or ice.  If your symptoms were to persist then might refer you to specialist.  Either ENT or oral surgeon.  Follow-up in 7 to 10 days or as needed.  Esperanza Richters, PA-C

## 2017-11-16 DIAGNOSIS — H1045 Other chronic allergic conjunctivitis: Secondary | ICD-10-CM | POA: Diagnosis not present

## 2017-11-16 DIAGNOSIS — J3089 Other allergic rhinitis: Secondary | ICD-10-CM | POA: Diagnosis not present

## 2017-11-16 DIAGNOSIS — L209 Atopic dermatitis, unspecified: Secondary | ICD-10-CM | POA: Diagnosis not present

## 2017-11-18 ENCOUNTER — Telehealth: Payer: Self-pay | Admitting: Medical

## 2017-11-18 ENCOUNTER — Ambulatory Visit: Payer: Self-pay | Admitting: *Deleted

## 2017-11-18 NOTE — Telephone Encounter (Signed)
Pt calling on advice on what to use for a yeast infection. Pt states she did try over the counter medication in December to treat a yeast infection but did not want to use that again because it caused her to hurt.Pt states she has experienced white, cottage cheese like discharge with a foul odor since Monday. Pt states that she has experienced itching and discomfort .Pt denies any abdominal pain and denies any pain with urination. Pt states her menstrual cycle was on 7/20 and she has been sexually active recently. Pt scheduled for appointment on 8/5 with Dr. Zola ButtonLowne-Chase due to PCP not being in the office. Pt advised to seek care in Urgent Care if symptoms become worse before scheduled appointment on Monday. Pt verbalized understanding.  Reason for Disposition . Bad smelling vaginal discharge  Answer Assessment - Initial Assessment Questions 1. DISCHARGE: "Describe the discharge." (e.g., white, yellow, green, gray, foamy, cottage cheese-like)     White cottage cheese like 2. ODOR: "Is there a bad odor?"     yes 3. ONSET: "When did the discharge begin?"     Monday 4. RASH: "Is there a rash in that area?" If so, ask: "Describe it." (e.g., redness, blisters, sores, bumps)     no 5. ABDOMINAL PAIN: "Are you having any abdominal pain?" If yes: "What does it feel like? " (e.g., crampy, dull, intermittent, constant)      no 6. ABDOMINAL PAIN SEVERITY: If present, ask: "How bad is it?"  (e.g., mild, moderate, severe)  - MILD - doesn't interfere with normal activities   - MODERATE - interferes with normal activities or awakens from sleep   - SEVERE - patient doesn't want to move (R/O peritonitis)      Denies any pain 7. CAUSE: "What do you think is causing the discharge?" "Have you had the same problem before? What happened then?"     Thinks that she has a yeast infection. Pt states a couple of months ago in December she used over the counter treatment for yeast infection 8. OTHER SYMPTOMS: "Do you have  any other symptoms?" (e.g., fever, itching, vaginal bleeding, pain with urination, injury to genital area, vaginal foreign body)     Vaginal itching with foul odor 9. PREGNANCY: "Is there any chance you are pregnant?" "When was your last menstrual period?"     No, last menstrual cycle was on 7/20  Protocols used: VAGINAL DISCHARGE-A-AH

## 2017-11-18 NOTE — Telephone Encounter (Unsigned)
Copied from CRM [phone]. Topic: General - Other >> Nov 18, 2017  4:52 PM Mcneil, Ja-Kwan wrote: Reason for CRM: Pt called in requesting to speak to a nurse in regards to allergies. Pt requests call back. [phone]

## 2017-11-18 NOTE — Telephone Encounter (Signed)
See triage encounter from 11/18/17

## 2017-11-21 ENCOUNTER — Ambulatory Visit (INDEPENDENT_AMBULATORY_CARE_PROVIDER_SITE_OTHER): Payer: BLUE CROSS/BLUE SHIELD | Admitting: Family Medicine

## 2017-11-21 ENCOUNTER — Encounter: Payer: Self-pay | Admitting: Family Medicine

## 2017-11-21 ENCOUNTER — Other Ambulatory Visit (HOSPITAL_COMMUNITY)
Admission: RE | Admit: 2017-11-21 | Discharge: 2017-11-21 | Disposition: A | Discharge status: Home / Self Care | Payer: BLUE CROSS/BLUE SHIELD | Source: Ambulatory Visit | Attending: Family Medicine | Admitting: Family Medicine

## 2017-11-21 VITALS — BP 113/73 | HR 66 | Temp 97.8°F | Resp 16 | Ht 63.0 in | Wt 174.8 lb

## 2017-11-21 DIAGNOSIS — N898 Other specified noninflammatory disorders of vagina: Secondary | ICD-10-CM

## 2017-11-21 LAB — POCT URINE PREGNANCY: Preg Test, Ur: NEGATIVE

## 2017-11-21 MED ORDER — METRONIDAZOLE 500 MG PO TABS
500.0000 mg | ORAL_TABLET | Freq: Two times a day (BID) | ORAL | 0 refills | Status: DC
Start: 2017-11-21 — End: 2018-02-01

## 2017-11-21 MED ORDER — FLUCONAZOLE 150 MG PO TABS: ORAL_TABLET | ORAL | 0 refills | Status: DC

## 2017-11-21 NOTE — Progress Notes (Signed)
Patient ID: Kristen Henderson, female    DOB: 12-Nov-1995  Age: 22 y.o. MRN: [id number]    Subjective:  Subjective  HPI Kristen Henderson presents for vaginal d/c x 1 week. + thin d/c with odor.  White.    Review of Systems  Constitutional: Negative for appetite change, diaphoresis, fatigue and unexpected weight change.  Eyes: Negative for pain, redness and visual disturbance.  Respiratory: Negative for cough, chest tightness, shortness of breath and wheezing.   Cardiovascular: Negative for chest pain, palpitations and leg swelling.  Endocrine: Negative for cold intolerance, heat intolerance, polydipsia, polyphagia and polyuria.  Genitourinary: Positive for vaginal discharge. Negative for decreased urine volume, difficulty urinating, dysuria, frequency, vaginal bleeding and vaginal pain.  Neurological: Negative for dizziness, light-headedness, numbness and headaches.    History Past Medical History:  Diagnosis Date  . Allergy   . Back pain   . Chronic right shoulder pain   . Eczema   . Eczema   . Environmental allergies     She has no past surgical history on file.   Her family history includes Allergic rhinitis in her brother; Asthma in her brother; Diabetes in her paternal grandmother; Eczema in her brother.She reports that she has never smoked. She has never used smokeless tobacco. She reports that she does not drink alcohol or use drugs.  Current Outpatient Medications on File Prior to Visit  Medication Sig Dispense Refill  . cyclobenzaprine (FLEXERIL) 10 MG tablet Take 1 tablet (10 mg total) by mouth 3 (three) times daily as needed for muscle spasms. 15 tablet 0  . diclofenac (VOLTAREN) 75 MG EC tablet Take 1 tablet (75 mg total) by mouth 2 (two) times daily. 30 tablet 0  . Fexofenadine HCl (ALLEGRA PO) Take 1 tablet by mouth daily.    . fluticasone (FLONASE) 50 MCG/ACT nasal spray Place 2 sprays into both nostrils daily. 16 g 11   No current  facility-administered medications on file prior to visit.      Objective:  Objective  Physical Exam  Constitutional: She is oriented to person, place, and time. She appears well-developed and well-nourished.  HENT:  Head: Normocephalic and atraumatic.  Eyes: Conjunctivae and EOM are normal.  Neck: Normal range of motion. Neck supple. No JVD present. Carotid bruit is not present. No thyromegaly present.  Cardiovascular: Normal rate, regular rhythm and normal heart sounds.  No murmur heard. Pulmonary/Chest: Effort normal and breath sounds normal. No respiratory distress. She has no wheezes. She has no rales. She exhibits no tenderness.  Abdominal: Soft. She exhibits no distension. There is no tenderness. There is no rebound, no guarding and no CVA tenderness.  Genitourinary: Pelvic exam was performed with patient supine. There is no rash, tenderness, lesion or injury on the right labia. There is no rash, tenderness, lesion or injury on the left labia. No erythema or tenderness in the vagina. Vaginal discharge found.  Genitourinary Comments: Culture done and sent   Musculoskeletal: She exhibits no edema.  Neurological: She is alert and oriented to person, place, and time.  Psychiatric: She has a normal mood and affect.  Nursing note and vitals reviewed.  BP 113/73 (BP Location: Left Arm, Cuff Size: Normal)   Pulse 66   Temp 97.8 F (36.6 C) (Oral)   Resp 16   Ht 5\' 3"  (1.6 m)   Wt 174 lb 12.8 oz (79.3 kg)   LMP 10/20/2017   SpO2 100%   BMI 30.96 kg/m  Wt Readings from Last 3 Encounters:  11/21/17 174 lb 12.8 oz (79.3 kg)  09/26/17 171 lb 6.4 oz (77.7 kg)  08/12/17 170 lb 4 oz (77.2 kg)     No results found for: WBC, HGB, HCT, PLT, GLUCOSE, CHOL, TRIG, HDL, LDLDIRECT, LDLCALC, ALT, AST, NA, K, CL, CREATININE, BUN, CO2, TSH, PSA, INR, GLUF, HGBA1C, MICROALBUR  Dg Shoulder Right  Result Date: 07/29/2017 CLINICAL DATA:  Shoulder pain for 3 years EXAM: RIGHT SHOULDER - 2+ VIEW  COMPARISON:  03/16/2014 FINDINGS: There is no evidence of fracture or dislocation. There is no evidence of arthropathy or other focal bone abnormality. Soft tissues are unremarkable. IMPRESSION: Negative. Electronically Signed   By: Jasmine Pang M.D.   On: 07/29/2017 15:46     Assessment & Plan:  Plan  I have discontinued Rosa Wyly. Henderson's loratadine, pimecrolimus, levocetirizine, and montelukast. I am also having her start on metroNIDAZOLE and fluconazole. Additionally, I am having her maintain her fluticasone, cyclobenzaprine, diclofenac, and Fexofenadine HCl (ALLEGRA PO).  Meds ordered this encounter  Medications  . metroNIDAZOLE (FLAGYL) 500 MG tablet    Sig: Take 1 tablet (500 mg total) by mouth 2 (two) times daily.    Dispense:  14 tablet    Refill:  0  . fluconazole (DIFLUCAN) 150 MG tablet    Sig: 1 po x1, may repeat in 3 days prn    Dispense:  2 tablet    Refill:  0    Problem List Items Addressed This Visit    None    Visit Diagnoses    Vaginal discharge    -  Primary   Relevant Medications   metroNIDAZOLE (FLAGYL) 500 MG tablet   fluconazole (DIFLUCAN) 150 MG tablet   Other Relevant Orders   Urine cytology ancillary only (Completed)   POCT urine pregnancy (Completed)      Follow-up: No follow-ups on file.  Donato Schultz, DO

## 2017-11-21 NOTE — Patient Instructions (Signed)

## 2017-11-23 ENCOUNTER — Telehealth: Payer: Self-pay

## 2017-11-23 LAB — URINE CYTOLOGY ANCILLARY ONLY
CHLAMYDIA, DNA PROBE: NEGATIVE
Candida vaginitis: NEGATIVE
NEISSERIA GONORRHEA: NEGATIVE
TRICH (WINDOWPATH): NEGATIVE

## 2017-11-23 NOTE — Telephone Encounter (Signed)
Copied from CRM [id number]. Topic: Inquiry >> Nov 23, 2017  8:32 AM Tamela OddiMartin, Don'[place], NT wrote: Reason for CRM: Patient called and states [facility] She returned to work the next day. She is wondering if you can call when the work note is ready for pick up CB# [phone]

## 2017-11-24 NOTE — Telephone Encounter (Signed)
Patient notified that labs are ready to pickup and lab results.

## 2017-12-22 ENCOUNTER — Telehealth: Payer: Self-pay | Admitting: Medical

## 2017-12-22 NOTE — Telephone Encounter (Signed)
Will route request to office; pt last seen by Dr Laury Axon, [facility],  11/21/17

## 2017-12-22 NOTE — Telephone Encounter (Signed)
Copied from CRM [id number]. Topic: Quick Communication - Rx Refill/Question >> Dec 22, 2017 11:31 AM Gerrianne Scale wrote: Medication: Morning after pill  Has the patient contacted their pharmacy? No. (Agent: If no, request that the patient contact the pharmacy for the refill.) (Agent: If yes, when and what did the pharmacy advise?)  Preferred Pharmacy (with phone number or street name):     [facility] - [place], [place] - [address] [phone] (Phone) [phone] (Fax)    Agent: Please be advised that RX refills may take up to 3 business days. We ask that you follow-up with your pharmacy.

## 2017-12-22 NOTE — Telephone Encounter (Signed)
Called patient advised Plan B was OTC per E. Saguier, PA-C. Patient said ok.

## 2017-12-22 NOTE — Telephone Encounter (Signed)
This medication if over the counter. Can get plan B at pharmacy.

## 2017-12-22 NOTE — Telephone Encounter (Signed)
Plan B is over the counter. She does not need prescription.

## 2018-02-01 ENCOUNTER — Ambulatory Visit: Payer: BLUE CROSS/BLUE SHIELD | Admitting: Medical

## 2018-02-01 ENCOUNTER — Encounter: Payer: Self-pay | Admitting: Medical

## 2018-02-01 VITALS — BP 104/68 | HR 73 | Temp 97.7°F | Resp 16 | Ht 63.0 in | Wt 179.8 lb

## 2018-02-01 DIAGNOSIS — R5383 Other fatigue: Secondary | ICD-10-CM

## 2018-02-01 DIAGNOSIS — M2550 Pain in unspecified joint: Secondary | ICD-10-CM | POA: Diagnosis not present

## 2018-02-01 DIAGNOSIS — R21 Rash and other nonspecific skin eruption: Secondary | ICD-10-CM | POA: Diagnosis not present

## 2018-02-01 LAB — COMPREHENSIVE METABOLIC PANEL
ALBUMIN: 4.1 g/dL (ref 3.5–5.2)
ALT: 10 U/L (ref 0–35)
AST: 14 U/L (ref 0–37)
Alkaline Phosphatase: 62 U/L (ref 39–117)
BILIRUBIN TOTAL: 0.5 mg/dL (ref 0.2–1.2)
BUN: 13 mg/dL (ref 6–23)
CHLORIDE: 103 meq/L (ref 96–112)
CO2: 29 meq/L (ref 19–32)
CREATININE: 0.6 mg/dL (ref 0.40–1.20)
Calcium: 9.4 mg/dL (ref 8.4–10.5)
GFR: 134.13 mL/min (ref >60.00)
Glucose, Bld: 87 mg/dL (ref 70–99)
Potassium: 4.3 mEq/L (ref 3.5–5.1)
Sodium: 137 mEq/L (ref 135–145)
Total Protein: 7.3 g/dL (ref 6.0–8.3)

## 2018-02-01 LAB — CBC WITH DIFFERENTIAL/PLATELET
BASOS PCT: 0.7 % (ref 0.0–3.0)
Basophils Absolute: 0 10*3/uL (ref 0.0–0.1)
EOS PCT: 4.1 % (ref 0.0–5.0)
Eosinophils Absolute: 0.2 10*3/uL (ref 0.0–0.7)
HCT: 40.1 % (ref 36.0–46.0)
HEMOGLOBIN: 13 g/dL (ref 12.0–15.0)
LYMPHS PCT: 20.5 % (ref 12.0–46.0)
Lymphs Abs: 1.2 10*3/uL (ref 0.7–4.0)
MCHC: 32.4 g/dL (ref 30.0–36.0)
MCV: 86.6 fl (ref 78.0–100.0)
MONOS PCT: 10.7 % (ref 3.0–12.0)
Monocytes Absolute: 0.6 10*3/uL (ref 0.1–1.0)
NEUTROS ABS: 3.9 10*3/uL (ref 1.4–7.7)
Neutrophils Relative %: 64 % (ref 43.0–77.0)
PLATELETS: 321 10*3/uL (ref 150.0–400.0)
RBC: 4.64 Mil/uL (ref 3.87–5.11)
RDW: 13.1 % (ref 11.5–15.5)
WBC: 6.1 10*3/uL (ref 4.0–10.5)

## 2018-02-01 LAB — C-REACTIVE PROTEIN: CRP: 0.2 mg/dL — ABNORMAL LOW (ref 0.5–20.0)

## 2018-02-01 LAB — VITAMIN D 25 HYDROXY (VIT D DEFICIENCY, FRACTURES): VITD: 16.1 ng/mL — ABNORMAL LOW (ref 30.00–100.00)

## 2018-02-01 LAB — TSH: TSH: 2.75 u[IU]/mL (ref 0.35–4.50)

## 2018-02-01 LAB — VITAMIN B12: Vitamin B-12: 312 pg/mL (ref 211–911)

## 2018-02-01 LAB — SEDIMENTATION RATE: Sed Rate: 30 mm/hr — ABNORMAL HIGH (ref 0–20)

## 2018-02-01 MED ORDER — METHYLPREDNISOLONE ACETATE 40 MG/ML IJ SUSP
40.0000 mg | Freq: Once | INTRAMUSCULAR | Status: AC
  Administered 2018-02-01: 40 mg via INTRAMUSCULAR

## 2018-02-01 MED ORDER — CEPHALEXIN 500 MG PO CAPS: 500.0000 mg | ORAL_CAPSULE | Freq: Two times a day (BID) | ORAL | 0 refills | Status: DC

## 2018-02-01 MED ORDER — PREDNISONE 10 MG PO TABS: ORAL_TABLET | ORAL | 0 refills | Status: DC

## 2018-02-01 MED FILL — CEPHALEXIN 500 MG CAPSULE: 500 | 7 days supply | Qty: 14 | Fill #0

## 2018-02-01 NOTE — Patient Instructions (Addendum)
For history of severe eczema and findings today on exam, I do think you would benefit from Depo-Medrol 40 mg IM injection and 6-day taper dose of prednisone.  Continue current regimen that dermatologist has advised.  I do think it also would be beneficial for you to follow-up with dermatologist and see if they have more definitive regiment of treatment that would prevent flares.  I think the topical treatment is guarded some in light of extended distribution.  Would inquire about potential injection treatments.  For recent fatigue and concern about weight loss, I did place orders to get a CBC, CMP, B12, B1, TSH and vitamin D level.  For intermittent right knee/arthralgia, I did place inflammatory lab studies to be done.  For your recent right sided small lump at the junction of the right axillary and right upper breast region, I will give Keflex antibiotic for 7 days and see if the area remains palpable in 1 week.  This area is very small feels like possible lymph node.  However we do need to proceed with caution.  If the area persists on follow-up would recommend getting ultrasound and possibly mammogram.  Also might consider getting dermatology opinion on the  Follow-up in 7 to 10 days or as needed.

## 2018-02-01 NOTE — Progress Notes (Signed)
Subjective:    Patient ID: Kristen Henderson, female    DOB: [birth year], 22 y.o.   MRN: [id number]  HPI  Pt in for recent rash of face. She has hx of chronic eczema in past. But mostly on body. When younger got rash behind knees, elbows and neck. But recently 4 weeks ago she got rash on her cheeks. She mentions slight flaky rash around her rt nipple. No dc from rt nipple. Left nipple is normal.   Occasional rt knee pain but not severe.  Pt went to allergist 2 months and told allergic to cat dander, dog dander, pollen and mold allergies.   Pt also has small lump on her rt upper rib/possible axillary area. No family history of breast cancer. LMP- Jan 21, 2018.   Pt has seen dermatologist in past. Only given topical creams but did not help much.(Also seen by other dermatologist.)    Review of Systems  Constitutional: Positive for fatigue.  HENT: Negative for congestion and dental problem.   Respiratory: Negative for choking, chest tightness, shortness of breath and wheezing.   Cardiovascular: Negative for chest pain and palpitations.  Gastrointestinal: Negative for abdominal pain.  Musculoskeletal: Negative for back pain, joint swelling and neck pain.  Skin: Positive for rash.  Neurological: Negative for dizziness and weakness.  Hematological: Positive for adenopathy. Does not bruise/bleed easily.       Possible rt axillary area.  Psychiatric/Behavioral: Negative for behavioral problems, confusion and sleep disturbance. The patient is not nervous/anxious and is not hyperactive.    Past Medical History:  Diagnosis Date  . Allergy   . Back pain   . Chronic right shoulder pain   . Eczema   . Eczema   . Environmental allergies      Social History   Socioeconomic History  . Marital status: Single    Spouse name: Not on file  . Number of children: Not on file  . Years of education: Not on file  . Highest education level: Not on file  Occupational History  . Not on  file  Social Needs  . Financial resource strain: Not on file  . Food insecurity:    Worry: Not on file    Inability: Not on file  . Transportation needs:    Medical: Not on file    Non-medical: Not on file  Tobacco Use  . Smoking status: Never Smoker  . Smokeless tobacco: Never Used  Substance and Sexual Activity  . Alcohol use: No  . Drug use: No  . Sexual activity: Not on file  Lifestyle  . Physical activity:    Days per week: Not on file    Minutes per session: Not on file  . Stress: Not on file  Relationships  . Social connections:    Talks on phone: Not on file    Gets together: Not on file    Attends religious service: Not on file    Active member of club or organization: Not on file    Attends meetings of clubs or organizations: Not on file    Relationship status: Not on file  . Intimate partner violence:    Fear of current or ex partner: Not on file    Emotionally abused: Not on file    Physically abused: Not on file    Forced sexual activity: Not on file  Other Topics Concern  . Not on file  Social History Narrative  . Not on file    No past  surgical history on file.  Family History  Problem Relation Age of Onset  . Allergic rhinitis Brother   . Asthma Brother   . Eczema Brother   . Diabetes Paternal Grandmother     No Known Allergies  Current Outpatient Medications on File Prior to Visit  Medication Sig Dispense Refill  . Crisaborole 2 % OINT Apply to affected areas of eyelids and around mouth daily as needed    . cyclobenzaprine (FLEXERIL) 10 MG tablet Take 1 tablet (10 mg total) by mouth 3 (three) times daily as needed for muscle spasms. 15 tablet 0  . Fexofenadine HCl (ALLEGRA PO) Take 1 tablet by mouth daily.    . Fluocinolone Acetonide Scalp 0.01 % OIL Apply to scalp nightly as needed    . fluticasone (FLONASE) 50 MCG/ACT nasal spray Place 2 sprays into both nostrils daily. 16 g 11   No current facility-administered medications on file prior  to visit.     BP 104/68   Pulse 73   Temp 97.7 F (36.5 C) (Oral)   Resp 16   Ht 5\' 3"  (1.6 m)   Wt 179 lb 12.8 oz (81.6 kg)   SpO2 99%   BMI 31.85 kg/m       Objective:   Physical Exam  General Mental Status- Alert. General Appearance- Not in acute distress.   Skin lichinified areas of skin both antecubital fossaes, and rt dorsal wrist. Also rash on both cheeks but not lichinified.  Neck Carotid Arteries- Normal color. Moisture- Normal Moisture. No carotid bruits. No JVD.  Chest and Lung Exam Auscultation: Breath Sounds:-Normal.  Cardiovascular Auscultation:Rythm- Regular. Murmurs & Other Heart Sounds:Auscultation of the heart reveals- No Murmurs.  Abdomen Inspection:-Inspeection Normal. Palpation/Percussion:Note:No mass. Palpation and Percussion of the abdomen reveal- Non Tender, Non Distended + BS, no rebound or guarding.   Neurologic Cranial Nerve exam:- CN III-XII intact(No nystagmus), symmetric smile. Strength:- 5/5 equal and symmetric strength both upper and lower extremities.  Breast- symmetric breast. No dc to nipple. Rt breast nipple does not look dry or flaky. Small mole present rt side nipple. Pt states has been stable in size for years. At junction of rt axillary and rt upper breast area. Small pea sized lump.       Assessment & Plan:  For history of severe eczema and findings today on exam, I do think you would benefit from Depo-Medrol 40 mg IM injection and 6-day taper dose of prednisone.  Continue current regimen that dermatologist has advised.  I do think it also would be beneficial for you to follow-up with dermatologist and see if they have more definitive regiment of treatment that would prevent flares.  I think the topical treatment is guarded some in light of extended distribution.  Would inquire about potential injection treatments.  For recent fatigue and concern about weight loss, I did place orders to get a CBC, CMP, B12, B1, TSH and  vitamin D level.  For intermittent right knee/arthralgia, I did place inflammatory lab studies to be done.  For your recent right sided small lump at the junction of the right axillary and right upper breast region, I will give Keflex antibiotic for 7 days and see if the area remains palpable in 1 week.  This area is very small feels like possible lymph node.  However we do need to proceed with caution.  If the area persists on follow-up would recommend getting ultrasound and possibly mammogram.  Follow-up in 7 to 10 days or as needed.  Mackie Pai, PA-C

## 2018-02-02 ENCOUNTER — Telehealth: Payer: Self-pay | Admitting: Medical

## 2018-02-02 DIAGNOSIS — R7989 Other specified abnormal findings of blood chemistry: Secondary | ICD-10-CM

## 2018-02-02 NOTE — Telephone Encounter (Signed)
Future vit d order placed. 

## 2018-02-03 ENCOUNTER — Telehealth: Payer: Self-pay | Admitting: Medical

## 2018-02-03 NOTE — Telephone Encounter (Signed)
Copied from CRM [id number]. Topic: Quick Communication - See Telephone Encounter >> Feb 03, 2018 10:20 AM Arlyss Gandy, NT wrote: CRM for notification. See Telephone encounter for: 02/03/18. Pt would like a call with her lab results. Please advise.

## 2018-02-03 NOTE — Telephone Encounter (Signed)
Notified pt if results. Pt states redness is getting better.

## 2018-02-05 LAB — HLA-B27 ANTIGEN: HLA-B27 Antigen: NEGATIVE

## 2018-02-05 LAB — ANA: Anti Nuclear Antibody(ANA): NEGATIVE

## 2018-02-05 LAB — RHEUMATOID FACTOR: Rhuematoid fact SerPl-aCnc: <14 IU/mL (ref <14)

## 2018-02-05 LAB — VITAMIN B1: Vitamin B1 (Thiamine): 12 nmol/L (ref 8–30)

## 2018-02-10 ENCOUNTER — Ambulatory Visit: Payer: BLUE CROSS/BLUE SHIELD | Admitting: Medical

## 2018-02-10 ENCOUNTER — Telehealth: Payer: Self-pay | Admitting: Medical

## 2018-02-10 MED ORDER — VITAMIN D (ERGOCALCIFEROL) 1.25 MG (50000 UNIT) PO CAPS: 50000.0000 [IU] | ORAL_CAPSULE | ORAL | 0 refills | Status: DC

## 2018-02-10 MED FILL — VIT D2 1.25 MG (50,000 UNIT: 1.25 MG | 56 days supply | Qty: 8 | Fill #0

## 2018-02-10 NOTE — Telephone Encounter (Signed)
Pt came in office and stated that received a call from our office stating that she is low on Vit D and that she needing to pick up her rx at pharmacy, pt went to pharmacy and there was no rx there, pt stated had to go to work and will come again to pick up her rx, if possible to send her rx to [facility] for her Vit D. Please advise ASAP. Pt stated would like to pick it up today.

## 2018-02-10 NOTE — Telephone Encounter (Signed)
Rx sent to pharmacy   

## 2018-02-16 ENCOUNTER — Ambulatory Visit: Payer: BLUE CROSS/BLUE SHIELD | Admitting: Medical

## 2018-02-16 DIAGNOSIS — Z0289 Encounter for other administrative examinations: Secondary | ICD-10-CM

## 2018-02-23 ENCOUNTER — Encounter: Payer: Self-pay | Admitting: Medical

## 2018-04-24 ENCOUNTER — Ambulatory Visit: Payer: BLUE CROSS/BLUE SHIELD | Admitting: Medical

## 2018-05-02 DIAGNOSIS — Z01419 Encounter for gynecological examination (general) (routine) without abnormal findings: Secondary | ICD-10-CM | POA: Diagnosis not present

## 2018-05-02 DIAGNOSIS — R87610 Atypical squamous cells of undetermined significance on cytologic smear of cervix (ASC-US): Secondary | ICD-10-CM | POA: Diagnosis not present

## 2018-05-02 DIAGNOSIS — Z3049 Encounter for surveillance of other contraceptives: Secondary | ICD-10-CM | POA: Diagnosis not present

## 2018-07-22 ENCOUNTER — Telehealth: Payer: Self-pay | Admitting: Medical

## 2018-07-22 DIAGNOSIS — N766 Ulceration of vulva: Secondary | ICD-10-CM | POA: Diagnosis not present

## 2018-07-22 NOTE — Telephone Encounter (Signed)
Patient called patient engagement center complaining of yeast infection symptoms.  Please contact patient [phone]

## 2018-07-24 NOTE — Telephone Encounter (Signed)
Tried calling Pt to set up virtual visit. No answer- unable to leave message.

## 2018-07-25 NOTE — Telephone Encounter (Signed)
Tried calling Pt again, no answer, unable to leave voice message.  

## 2018-11-28 IMAGING — DX DG SHOULDER 2+V*R*
3 series · 3 of 3 positions shown · non-contrast
Comparison: 03/16/2014

CLINICAL DATA: Shoulder pain for 3 years

EXAM:
RIGHT SHOULDER - 2+ VIEW

[shoulder grashey]
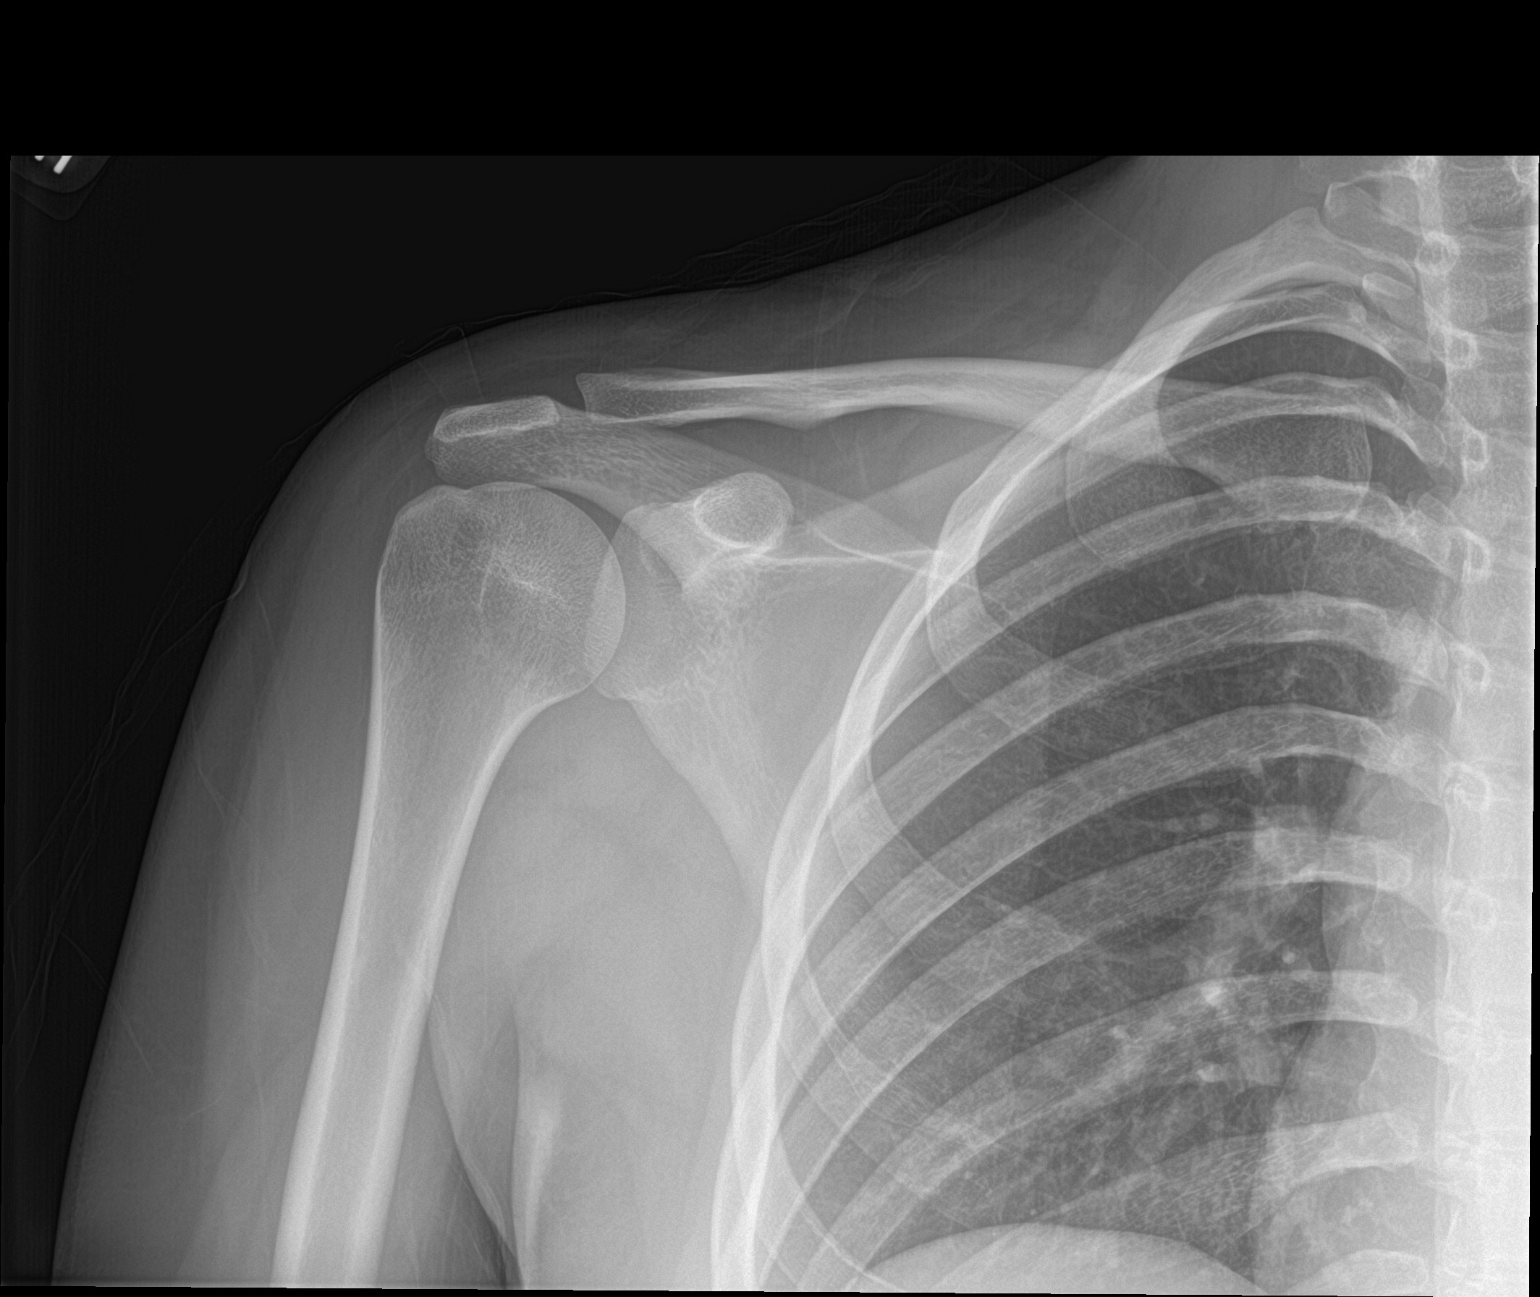

[shoulder y view]
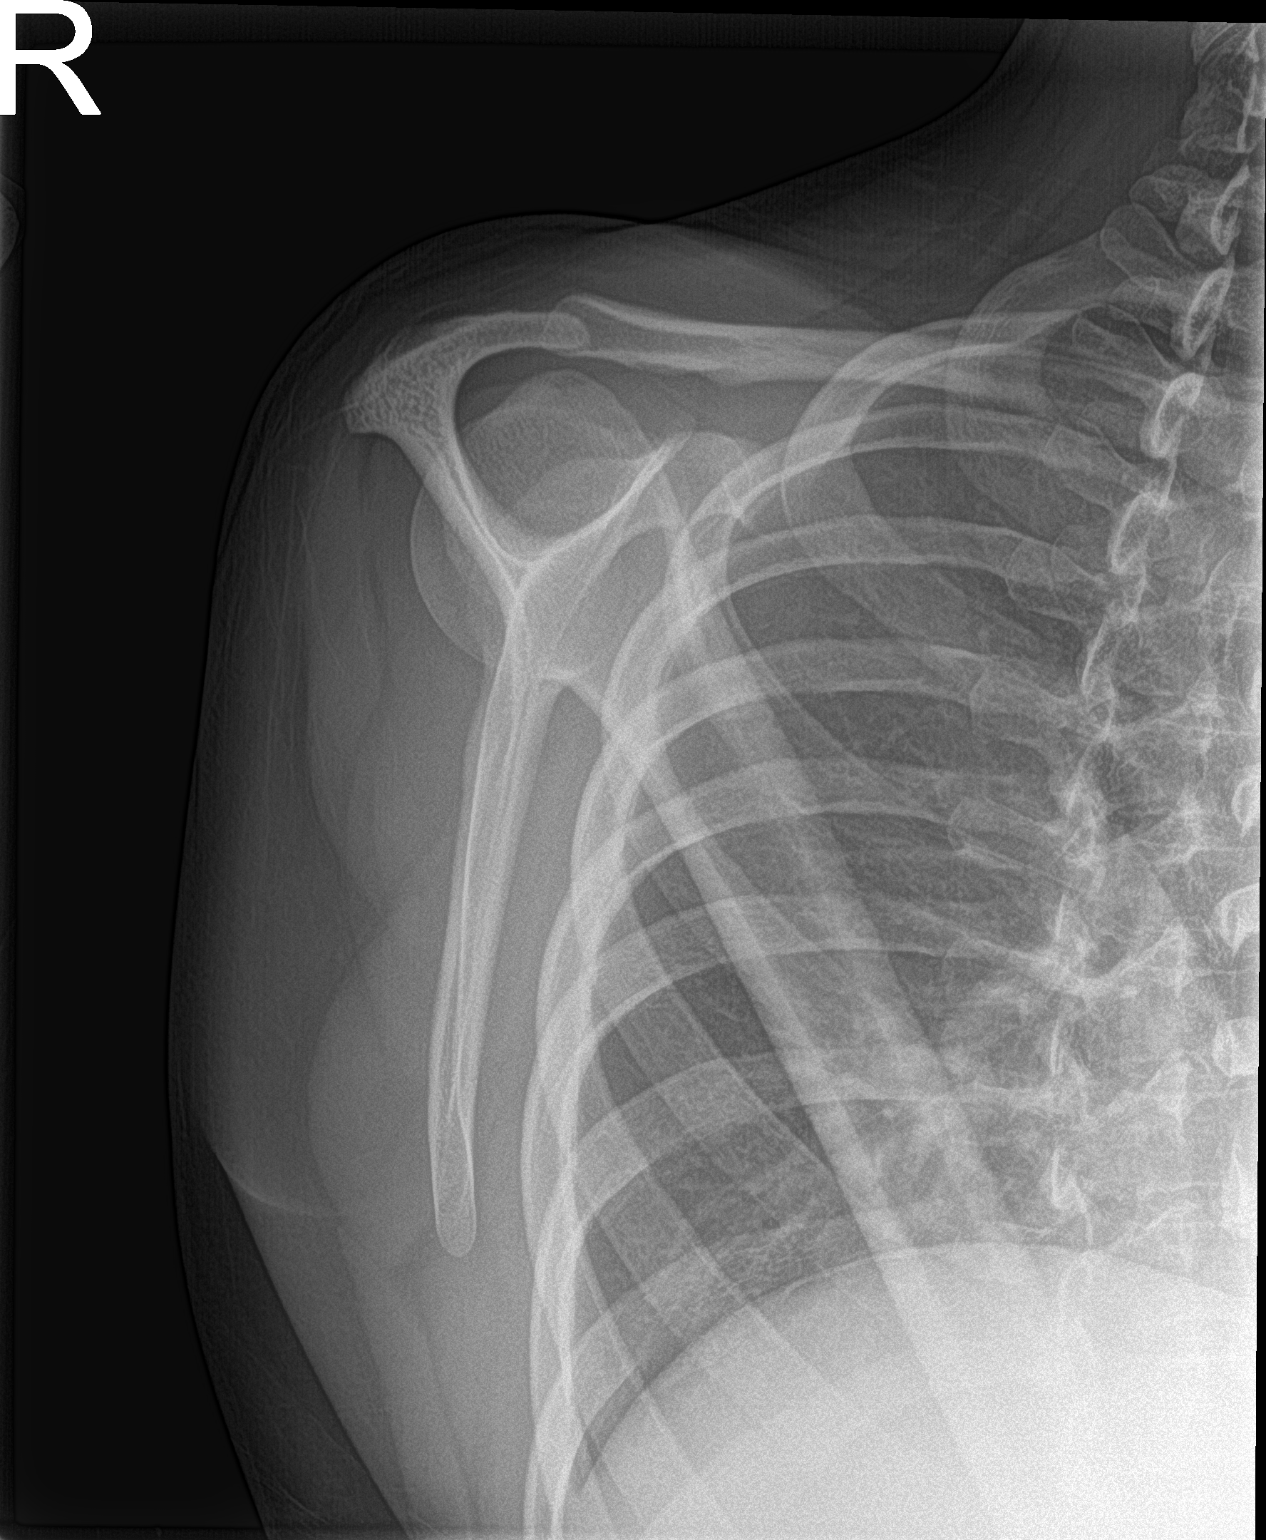

[shoulder axillary]
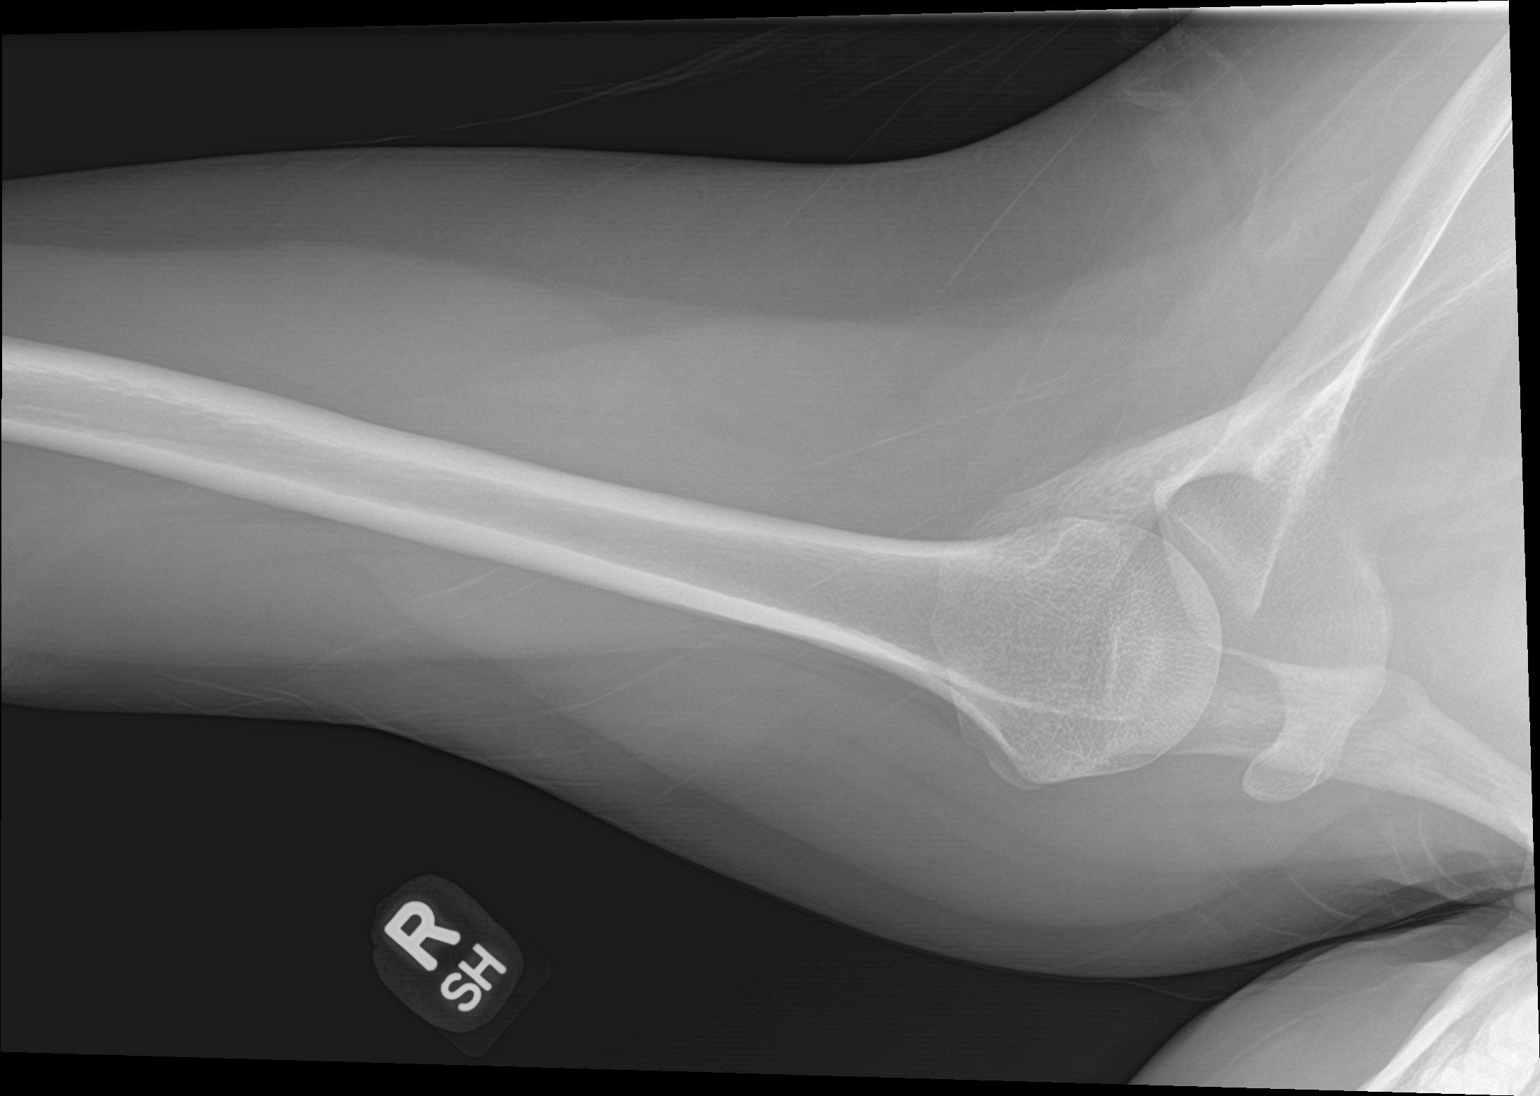

[3 of 3 positions shown; findings below may reference images not displayed]

FINDINGS: There is no evidence of fracture or dislocation. There is no
evidence of arthropathy or other focal bone abnormality. Soft
tissues are unremarkable.
IMPRESSION: Negative.

## 2018-12-28 ENCOUNTER — Other Ambulatory Visit: Payer: Self-pay

## 2018-12-28 DIAGNOSIS — Z20822 Contact with and (suspected) exposure to covid-19: Secondary | ICD-10-CM

## 2018-12-30 LAB — NOVEL CORONAVIRUS, NAA: SARS-CoV-2, NAA: NOT DETECTED

## 2019-03-14 ENCOUNTER — Other Ambulatory Visit: Payer: Self-pay

## 2019-03-14 DIAGNOSIS — Z20822 Contact with and (suspected) exposure to covid-19: Secondary | ICD-10-CM

## 2019-03-16 LAB — NOVEL CORONAVIRUS, NAA: SARS-CoV-2, NAA: NOT DETECTED

## 2019-03-22 ENCOUNTER — Other Ambulatory Visit: Payer: Self-pay

## 2019-03-22 DIAGNOSIS — Z20822 Contact with and (suspected) exposure to covid-19: Secondary | ICD-10-CM

## 2019-03-25 LAB — NOVEL CORONAVIRUS, NAA: SARS-CoV-2, NAA: NOT DETECTED

## 2019-03-30 ENCOUNTER — Other Ambulatory Visit: Payer: Self-pay

## 2019-03-30 DIAGNOSIS — Z20822 Contact with and (suspected) exposure to covid-19: Secondary | ICD-10-CM

## 2019-04-01 LAB — NOVEL CORONAVIRUS, NAA: SARS-CoV-2, NAA: NOT DETECTED

## 2022-09-27 ENCOUNTER — Encounter (HOSPITAL_COMMUNITY): Payer: Self-pay

## 2022-09-27 ENCOUNTER — Other Ambulatory Visit: Payer: Self-pay

## 2022-09-27 ENCOUNTER — Ambulatory Visit
Admission: EM | Admit: 2022-09-27 | Discharge: 2022-09-27 | Disposition: A | Discharge status: Home / Self Care | Payer: Medicaid Other

## 2022-09-27 ENCOUNTER — Observation Stay (HOSPITAL_COMMUNITY)
Admission: EM | Admit: 2022-09-27 | Discharge: 2022-09-28 | Disposition: A | Discharge status: Home / Self Care | Payer: Medicaid Other | Attending: Surgery | Admitting: Surgery

## 2022-09-27 ENCOUNTER — Emergency Department (HOSPITAL_COMMUNITY): Payer: Medicaid Other

## 2022-09-27 DIAGNOSIS — R1011 Right upper quadrant pain: Secondary | ICD-10-CM

## 2022-09-27 DIAGNOSIS — K819 Cholecystitis, unspecified: Secondary | ICD-10-CM

## 2022-09-27 DIAGNOSIS — K8012 Calculus of gallbladder with acute and chronic cholecystitis without obstruction: Principal | ICD-10-CM | POA: Insufficient documentation

## 2022-09-27 DIAGNOSIS — K810 Acute cholecystitis: Secondary | ICD-10-CM | POA: Diagnosis present

## 2022-09-27 DIAGNOSIS — R112 Nausea with vomiting, unspecified: Secondary | ICD-10-CM | POA: Diagnosis not present

## 2022-09-27 LAB — COMPREHENSIVE METABOLIC PANEL
ALT: 14 U/L (ref 0–44)
AST: 20 U/L (ref 15–41)
Albumin: 4.4 g/dL (ref 3.5–5.0)
Alkaline Phosphatase: 57 U/L (ref 38–126)
Anion gap: 10 (ref 5–15)
BUN: 13 mg/dL (ref 6–20)
CO2: 24 mmol/L (ref 22–32)
Calcium: 10 mg/dL (ref 8.9–10.3)
Chloride: 102 mmol/L (ref 98–111)
Creatinine, Ser: 0.62 mg/dL (ref 0.44–1.00)
GFR, Estimated: >60 mL/min (ref >60)
Glucose, Bld: 107 mg/dL — ABNORMAL HIGH (ref 70–99)
Potassium: 3.4 mmol/L — ABNORMAL LOW (ref 3.5–5.1)
Sodium: 136 mmol/L (ref 135–145)
Total Bilirubin: 0.9 mg/dL (ref 0.3–1.2)
Total Protein: 8.4 g/dL — ABNORMAL HIGH (ref 6.5–8.1)

## 2022-09-27 LAB — CBC
HCT: 42.5 % (ref 36.0–46.0)
Hemoglobin: 13.7 g/dL (ref 12.0–15.0)
MCH: 28.2 pg (ref 26.0–34.0)
MCHC: 32.2 g/dL (ref 30.0–36.0)
MCV: 87.6 fL (ref 80.0–100.0)
Platelets: 306 10*3/uL (ref 150–400)
RBC: 4.85 MIL/uL (ref 3.87–5.11)
RDW: 12.1 % (ref 11.5–15.5)
WBC: 10.8 10*3/uL — ABNORMAL HIGH (ref 4.0–10.5)
nRBC: 0 % (ref 0.0–0.2)

## 2022-09-27 LAB — URINALYSIS, ROUTINE W REFLEX MICROSCOPIC
Bacteria, UA: NONE SEEN
Bilirubin Urine: NEGATIVE
Glucose, UA: NEGATIVE mg/dL
Hgb urine dipstick: NEGATIVE
Ketones, ur: NEGATIVE mg/dL
Nitrite: NEGATIVE
Protein, ur: NEGATIVE mg/dL
Specific Gravity, Urine: 1.021 (ref 1.005–1.030)
pH: 5 (ref 5.0–8.0)

## 2022-09-27 LAB — LIPASE, BLOOD: Lipase: 33 U/L (ref 11–51)

## 2022-09-27 LAB — I-STAT BETA HCG BLOOD, ED (MC, WL, AP ONLY): I-stat hCG, quantitative: <5 m[IU]/mL (ref <5)

## 2022-09-27 MED ORDER — METOPROLOL TARTRATE 5 MG/5ML IV SOLN: 5.0000 mg | Freq: Four times a day (QID) | INTRAVENOUS | Status: DC | PRN

## 2022-09-27 MED ORDER — KETOROLAC TROMETHAMINE 30 MG/ML IJ SOLN
30.0000 mg | Freq: Three times a day (TID) | INTRAMUSCULAR | Status: DC
  Administered 2022-09-27 – 2022-09-28 (×4): 30 mg via INTRAVENOUS
  Filled 2022-09-27 (×4): qty 1

## 2022-09-27 MED ORDER — KCL IN DEXTROSE-NACL 20-5-0.45 MEQ/L-%-% IV SOLN
INTRAVENOUS | Status: DC
  Filled 2022-09-27 (×3): qty 1000

## 2022-09-27 MED ORDER — FAMOTIDINE IN NACL 20-0.9 MG/50ML-% IV SOLN
20.0000 mg | Freq: Once | INTRAVENOUS | Status: AC
  Administered 2022-09-27: 20 mg via INTRAVENOUS
  Filled 2022-09-27: qty 50

## 2022-09-27 MED ORDER — MORPHINE SULFATE (PF) 2 MG/ML IV SOLN
1.0000 mg | INTRAVENOUS | Status: DC | PRN
  Administered 2022-09-28: 2 mg via INTRAVENOUS
  Filled 2022-09-27: qty 1

## 2022-09-27 MED ORDER — ACETAMINOPHEN 500 MG PO TABS
1000.0000 mg | ORAL_TABLET | Freq: Four times a day (QID) | ORAL | Status: DC
  Administered 2022-09-27: 1000 mg via ORAL
  Filled 2022-09-27 (×2): qty 2

## 2022-09-27 MED ORDER — FLUTICASONE PROPIONATE 50 MCG/ACT NA SUSP
2.0000 | Freq: Every day | NASAL | Status: DC
  Administered 2022-09-27: 2 via NASAL
  Filled 2022-09-27: qty 16

## 2022-09-27 MED ORDER — SODIUM CHLORIDE 0.9 % IV BOLUS
1000.0000 mL | Freq: Once | INTRAVENOUS | Status: AC
  Administered 2022-09-27: 1000 mL via INTRAVENOUS

## 2022-09-27 MED ORDER — SODIUM CHLORIDE 0.9 % IV SOLN
2.0000 g | Freq: Every day | INTRAVENOUS | Status: DC
  Administered 2022-09-27 – 2022-09-28 (×2): 2 g via INTRAVENOUS
  Filled 2022-09-27 (×2): qty 20

## 2022-09-27 MED ORDER — ONDANSETRON 4 MG PO TBDP: 4.0000 mg | ORAL_TABLET | Freq: Four times a day (QID) | ORAL | Status: DC | PRN

## 2022-09-27 MED ORDER — MORPHINE SULFATE (PF) 4 MG/ML IV SOLN
4.0000 mg | Freq: Once | INTRAVENOUS | Status: AC
  Administered 2022-09-27: 4 mg via INTRAVENOUS
  Filled 2022-09-27: qty 1

## 2022-09-27 MED ORDER — MELATONIN 3 MG PO TABS: 3.0000 mg | ORAL_TABLET | Freq: Every evening | ORAL | Status: DC | PRN

## 2022-09-27 MED ORDER — HYDROCORTISONE 1 % EX CREA: TOPICAL_CREAM | Freq: Two times a day (BID) | CUTANEOUS | Status: DC | PRN

## 2022-09-27 MED ORDER — PANTOPRAZOLE SODIUM 40 MG PO TBEC
40.0000 mg | DELAYED_RELEASE_TABLET | Freq: Every day | ORAL | Status: DC
  Administered 2022-09-27 – 2022-09-28 (×2): 40 mg via ORAL
  Filled 2022-09-27 (×2): qty 1

## 2022-09-27 MED ORDER — ONDANSETRON HCL 4 MG/2ML IJ SOLN
4.0000 mg | Freq: Four times a day (QID) | INTRAMUSCULAR | Status: DC | PRN
  Administered 2022-09-27: 4 mg via INTRAVENOUS
  Filled 2022-09-27 (×2): qty 2

## 2022-09-27 MED ORDER — DIPHENHYDRAMINE HCL 50 MG/ML IJ SOLN: 25.0000 mg | Freq: Four times a day (QID) | INTRAMUSCULAR | Status: DC | PRN

## 2022-09-27 MED ORDER — LORATADINE 10 MG PO TABS
10.0000 mg | ORAL_TABLET | Freq: Every day | ORAL | Status: DC
  Administered 2022-09-28: 10 mg via ORAL
  Filled 2022-09-27: qty 1

## 2022-09-27 MED ORDER — DIPHENHYDRAMINE HCL 25 MG PO CAPS: 25.0000 mg | ORAL_CAPSULE | Freq: Four times a day (QID) | ORAL | Status: DC | PRN

## 2022-09-27 MED ORDER — ONDANSETRON HCL 4 MG/2ML IJ SOLN
4.0000 mg | Freq: Once | INTRAMUSCULAR | Status: AC
  Administered 2022-09-27: 4 mg via INTRAVENOUS
  Filled 2022-09-27: qty 2

## 2022-09-27 MED ORDER — KETOROLAC TROMETHAMINE 30 MG/ML IJ SOLN: 30.0000 mg | Freq: Three times a day (TID) | INTRAMUSCULAR | Status: DC | PRN

## 2022-09-27 MED ORDER — ENOXAPARIN SODIUM 40 MG/0.4ML IJ SOSY
40.0000 mg | PREFILLED_SYRINGE | Freq: Every day | INTRAMUSCULAR | Status: DC
  Administered 2022-09-27: 40 mg via SUBCUTANEOUS
  Filled 2022-09-27: qty 0.4

## 2022-09-27 MED ORDER — PROCHLORPERAZINE EDISYLATE 10 MG/2ML IJ SOLN
10.0000 mg | Freq: Four times a day (QID) | INTRAMUSCULAR | Status: DC | PRN
  Administered 2022-09-27: 10 mg via INTRAVENOUS
  Filled 2022-09-27: qty 2

## 2022-09-27 MED ORDER — OXYCODONE HCL 5 MG PO TABS: 5.0000 mg | ORAL_TABLET | ORAL | Status: DC | PRN

## 2022-09-27 MED ORDER — SIMETHICONE 80 MG PO CHEW: 40.0000 mg | CHEWABLE_TABLET | Freq: Four times a day (QID) | ORAL | Status: DC | PRN

## 2022-09-27 NOTE — ED Provider Notes (Signed)
[facility]    CSN: [id number] Arrival date & time: 09/27/22  1111      History   Chief Complaint Chief Complaint  Patient presents with   Abdominal Pain   Nausea    HPI Kristen Henderson is a 27 y.o. female.   Patient presents with severe upper abdominal pain that started around 4 AM this morning.  Patient reports that she has had nausea and vomiting as well.  Denies blood in emesis.  Reports soft stool this morning that was green in color.  Reports that she has intermittent abdominal pain in similar area every few months but it typically resolves after 2 hours and is never this severe.  She rates abdominal pain 10/10 on pain scale and describes it as a constant, sharp pain.  Denies any associated fever.  Last menstrual cycle completed last week.   Abdominal Pain   Past Medical History:  Diagnosis Date   Allergy    Back pain    Chronic right shoulder pain    Eczema    Eczema    Environmental allergies     There are no problems to display for this patient.   No past surgical history on file.  OB History   No obstetric history on file.      Home Medications    Prior to Admission medications   Medication Sig Start Date End Date Taking? Authorizing Provider  bismuth subsalicylate (PEPTO BISMOL) 262 MG/15ML suspension Take 30 mLs by mouth every 6 (six) hours as needed.   Yes [provider]  calcium carbonate (TUMS EX) 750 MG chewable tablet Chew 1 tablet by mouth daily.   Yes [provider]  cephALEXin (KEFLEX) 500 MG capsule Take 1 capsule (500 mg total) by mouth 2 (two) times daily. 02/01/18   Saguier, Ramon Dredge, PA-C  Crisaborole 2 % OINT Apply to affected areas of eyelids and around mouth daily as needed 02/09/16   [provider]  cyclobenzaprine (FLEXERIL) 10 MG tablet Take 1 tablet (10 mg total) by mouth 3 (three) times daily as needed for muscle spasms. 09/26/17   Saguier, Ramon Dredge, PA-C  Fexofenadine HCl (ALLEGRA  PO) Take 1 tablet by mouth daily.    [provider]  Fluocinolone Acetonide Scalp 0.01 % OIL Apply to scalp nightly as needed 02/09/16   [provider]  fluticasone (FLONASE) 50 MCG/ACT nasal spray Place 2 sprays into both nostrils daily. 07/29/17   Saguier, Ramon Dredge, PA-C  predniSONE (DELTASONE) 10 MG tablet 6 TAB PO DAY 1 5 TAB PO DAY 2 4 TAB PO DAY 3 3 TAB PO DAY 4 2 TAB PO DAY 5 1 TAB PO DAY 6 02/01/18   Saguier, Ramon Dredge, PA-C  Vitamin D, Ergocalciferol, (DRISDOL) 50000 units CAPS capsule Take 1 capsule (50,000 Units total) by mouth every 7 (seven) days. 02/10/18   Saguier, Ramon Dredge, PA-C    Family History Family History  Problem Relation Age of Onset   Allergic rhinitis Brother    Asthma Brother    Eczema Brother    Diabetes Paternal Grandmother     Social History Social History   Tobacco Use   Smoking status: Never   Smokeless tobacco: Never  Vaping Use   Vaping Use: Never used  Substance Use Topics   Alcohol use: Yes    Comment: Occa   Drug use: Never     Allergies   Patient has no known allergies.   Review of Systems Review of Systems Per HPI  Physical Exam Triage Vital Signs ED Triage Vitals [09/27/22 1129]  Enc Vitals Group     BP 126/82     Pulse Rate 66     Resp 20     Temp 98.1 F (36.7 C)     Temp Source Oral     SpO2 98 %     Weight      Height      Head Circumference      Peak Flow      Pain Score 10     Pain Loc      Pain Edu?      Excl. in GC?    No data found.  Updated Vital Signs BP 126/82 (BP Location: Left Arm)   Pulse 66   Temp 98.1 F (36.7 C) (Oral)   Resp 20   LMP  (Within Weeks) Comment: 1 week  SpO2 98%   Visual Acuity Right Eye Distance:   Left Eye Distance:   Bilateral Distance:    Right Eye Near:   Left Eye Near:    Bilateral Near:     Physical Exam Constitutional:      General: She is not in acute distress.    Appearance: Normal appearance. She is ill-appearing. She is not  toxic-appearing or diaphoretic.     Comments: Patient swaying back-and-forth due to pain.  HENT:     Head: Normocephalic and atraumatic.  Eyes:     Extraocular Movements: Extraocular movements intact.     Conjunctiva/sclera: Conjunctivae normal.  Cardiovascular:     Rate and Rhythm: Normal rate and regular rhythm.     Pulses: Normal pulses.     Heart sounds: Normal heart sounds.  Pulmonary:     Effort: Pulmonary effort is normal. No respiratory distress.     Breath sounds: Normal breath sounds.  Abdominal:     General: Bowel sounds are normal. There is no distension.     Palpations: Abdomen is soft.     Tenderness: There is abdominal tenderness in the right upper quadrant.     Comments: Patient is significantly tender to palpation to right upper quadrant of abdomen.  Neurological:     General: No focal deficit present.     Mental Status: She is alert and oriented to person, place, and time. Mental status is at baseline.  Psychiatric:        Mood and Affect: Mood normal.        Behavior: Behavior normal.        Thought Content: Thought content normal.        Judgment: Judgment normal.      UC Treatments / Results  Labs (all labs ordered are listed, but only abnormal results are displayed) Labs Reviewed - No data to display  EKG   Radiology No results found.  Procedures Procedures (including critical care time)  Medications Ordered in UC Medications - No data to display  Initial Impression / Assessment and Plan / UC Course  I have reviewed the triage vital signs and the nursing notes.  Pertinent labs & imaging results that were available during my care of the patient were reviewed by me and considered in my medical decision making (see chart for details).     Patient is significantly tender to palpation to abdomen and appears to be in a significant amount of pain, therefore, patient was advised to go to the emergency department for further evaluation and  management as she most likely needs CT imaging of the abdomen.  Patient was agreeable to this plan.  Vital signs stable at discharge.  Agree with the patient's family member transporting her to the ER. Final Clinical Impressions(s) / UC Diagnoses   Final diagnoses:  Abdominal pain, right upper quadrant  Nausea and vomiting, unspecified vomiting type     Discharge Instructions      Please go straight to the emergency department as soon as you leave urgent care for further evaluation and management.    ED Prescriptions   None    PDMP not reviewed this encounter.   Gustavus Bryant, [place] 09/27/22 1144

## 2022-09-27 NOTE — ED Triage Notes (Signed)
Pt reports epigastric pain, right upper quadrant abdominal pain, nausea and vomiting since 4 am today. Pain is worse when bending or laying in her back. Pepto Bismol and Tums gives no relief.

## 2022-09-27 NOTE — H&P (Signed)
Kristen Henderson 11/14/1995  [id number].    Chief Complaint/Reason for Consult: cholecystitis, cholelithiasis  HPI:  This is a pleasant 27 yo female who has eczema and seasonal allergies who has been having some intermittent epigastric and RUQ abdominal pain for the last 6 months.  She thought it was secondary to reflux.  About 6 months ago, she ate red meat for the first time in years and thought that may have contributed. Her pain went away in about 2 hours.  She did have some nausea and vomiting.  Intermittently since then, she has had small incidents, and as above, thought this was reflux.  She had a chicken wrap last night and woke up around 0400 this morning with severe epigastric and RUQ abdominal pain.  She had some N/V.  She took reflux medication this morning, but this made no difference in her symptoms.  She denies diarrhea, fevers, CP, SOB, but some pain radiating to her back.  She was seen at the urgent care who suggested she come to the ED.  Upon arrival, she has been found to have a large 2.6cm gallstone around the neck of the gallbladder with no other signs of cholecystitis.  Her LFTs are normal and WBC is 10.8.  Her pain persists c/w early cholecystitis.  We have been asked to see her for further evaluation and recommendations.  ROS: ROS: Please see HPI  Family History  Problem Relation Age of Onset   Allergic rhinitis Brother    Asthma Brother    Eczema Brother    Diabetes Paternal Grandmother     Past Medical History:  Diagnosis Date   Allergy    Back pain    Chronic right shoulder pain    Eczema    Eczema    Environmental allergies     History reviewed. No pertinent surgical history.  Social History:  reports that she has never smoked. She has never used smokeless tobacco. She reports current alcohol use. She reports that she does not use drugs.  Allergies: No Known Allergies   Physical Exam: Blood pressure 126/81, pulse (!) 59, temperature  98.7 F (37.1 C), temperature source Oral, resp. rate 16, height 5\' 3"  (1.6 m), weight 83.5 kg, SpO2 100 %. General: pleasant, WD, WN female who is laying in bed in NAD HEENT: head is normocephalic, atraumatic.  Sclera are noninjected.  PERRL.  Ears and nose without any masses or lesions.  Mouth is pink and moist Heart: regular, rate, and rhythm.  Normal s1,s2. No obvious murmurs, gallops, or rubs noted.  Palpable radial and pedal pulses bilaterally Lungs: CTAB, no wheezes, rhonchi, or rales noted.  Respiratory effort nonlabored Abd: soft, tender in RUQ and epigastrium, mild Murphy's sign, ND, +BS, no masses, hernias, or organomegaly MS: all 4 extremities are symmetrical with no cyanosis, clubbing, or edema. Psych: A&Ox3 with an appropriate affect.   Results for orders placed or performed during the hospital encounter of 09/27/22 (from the past 48 hour(s))  Urinalysis, Routine w reflex microscopic -Urine, Clean Catch     Status: Abnormal   Collection Time: 09/27/22 12:28 PM  Result Value Ref Range   Color, Urine YELLOW YELLOW   APPearance CLEAR CLEAR   Specific Gravity, Urine 1.021 1.005 - 1.030   pH 5.0 5.0 - 8.0   Glucose, UA NEGATIVE NEGATIVE mg/dL   Hgb urine dipstick NEGATIVE NEGATIVE   Bilirubin Urine NEGATIVE NEGATIVE   Ketones, ur NEGATIVE NEGATIVE mg/dL   Protein, ur NEGATIVE NEGATIVE  mg/dL   Nitrite NEGATIVE NEGATIVE   Leukocytes,Ua TRACE (A) NEGATIVE   RBC / HPF 6-10 0 - 5 RBC/hpf   WBC, UA 0-5 0 - 5 WBC/hpf   Bacteria, UA NONE SEEN NONE SEEN   Squamous Epithelial / HPF 6-10 0 - 5 /HPF   Mucus PRESENT     Comment: Performed at [facility], [address] [place], [place] [address]  Lipase, blood     Status: None   Collection Time: 09/27/22  1:00 PM  Result Value Ref Range   Lipase 33 11 - 51 U/L    Comment: Performed at [facility], [address] [place], [place] [address]  Comprehensive metabolic panel     Status: Abnormal    Collection Time: 09/27/22  1:00 PM  Result Value Ref Range   Sodium 136 135 - 145 mmol/L   Potassium 3.4 (L) 3.5 - 5.1 mmol/L   Chloride 102 98 - 111 mmol/L   CO2 24 22 - 32 mmol/L   Glucose, Bld 107 (H) 70 - 99 mg/dL    Comment: Glucose reference range applies only to samples taken after fasting for at least 8 hours.   BUN 13 6 - 20 mg/dL   Creatinine, Ser 7.03 0.44 - 1.00 mg/dL   Calcium 50.0 8.9 - 93.8 mg/dL   Total Protein 8.4 (H) 6.5 - 8.1 g/dL   Albumin 4.4 3.5 - 5.0 g/dL   AST 20 15 - 41 U/L   ALT 14 0 - 44 U/L   Alkaline Phosphatase 57 38 - 126 U/L   Total Bilirubin 0.9 0.3 - 1.2 mg/dL   GFR, Estimated >18 >29 mL/min    Comment: (NOTE) Calculated using the CKD-EPI Creatinine Equation (2021)    Anion gap 10 5 - 15    Comment: Performed at [facility], [address] [place], [place] [address]  CBC     Status: Abnormal   Collection Time: 09/27/22  1:00 PM  Result Value Ref Range   WBC 10.8 (H) 4.0 - 10.5 K/uL   RBC 4.85 3.87 - 5.11 MIL/uL   Hemoglobin 13.7 12.0 - 15.0 g/dL   HCT 96.7 89.3 - 81.0 %   MCV 87.6 80.0 - 100.0 fL   MCH 28.2 26.0 - 34.0 pg   MCHC 32.2 30.0 - 36.0 g/dL   RDW 17.5 10.2 - 58.5 %   Platelets 306 150 - 400 K/uL   nRBC 0.0 0.0 - 0.2 %    Comment: Performed at [facility], [address] [place], [place] [address]  I-Stat beta hCG blood, ED     Status: None   Collection Time: 09/27/22  1:19 PM  Result Value Ref Range   I-stat hCG, quantitative <5.0 <5 mIU/mL   Comment 3            Comment:   GEST. AGE      CONC.  (mIU/mL)   <=1 WEEK        5 - 50     2 WEEKS       50 - 500     3 WEEKS       100 - 10,000     4 WEEKS     1,000 - 30,000        FEMALE AND NON-PREGNANT FEMALE:     LESS THAN 5 mIU/mL    US Abdomen Limited RUQ (LIVER/GB)  Result Date: 09/27/2022 CLINICAL DATA:  Pain right  upper quadrant EXAM: ULTRASOUND ABDOMEN LIMITED RIGHT UPPER QUADRANT COMPARISON:  None Available. FINDINGS:  Gallbladder: There is a large 2.6 cm hyperechoic focus with acoustic shadowing in the dependent portion of neck of the gallbladder. There is no fluid around the gallbladder. There is no significant wall thickening in gallbladder. Technologist did not observe any focal tenderness over the gallbladder. Common bile duct: Diameter: 4.5 mm Liver: No focal lesion identified. Within normal limits in parenchymal echogenicity. Portal vein is patent on color Doppler imaging with normal direction of blood flow towards the liver. Other: None. IMPRESSION: Large gallbladder stone. There are no imaging signs of acute cholecystitis. There is no dilation of bile ducts. Electronically Signed   By: Ernie Avena M.D.   On: 09/27/2022 13:43      Assessment/Plan Early cholecystitis with cholelithiasis The patient has been seen, examined, chart, labs, vitals, and imaging personally reviewed.  Her imaging does not suggest cholecystitis, but given her symptoms and a stone in the neck of her gallbladder, it is likely she has early cholecystitis.  We will plan to get her admitted and start her on Rocephin as well as medications for pain control.  She will be given CLD tonight and NPO p MN for OR tomorrow.  This was discussed with the patient and she agreed with the plan.  All questions answered.   FEN - CLD, NPO p MN, IVFs VTE - lovenox ID - Rocephin Admit - admit obs, med-surg  Eczema - hydrocortisone cream prn Seasonal allergies - resume home meds  I reviewed ED provider notes, last 24 h vitals and pain scores, last 48 h intake and output, last 24 h labs and trends, and last 24 h imaging results.  Letha Cape, [facility] 09/27/2022, 3:20 PM Please see Amion for pager number during day hours 7:00am-4:30pm or 7:00am -11:30am on weekends

## 2022-09-27 NOTE — Discharge Instructions (Signed)
Please go straight to the emergency department as soon as you leave urgent care for further evaluation and management. 

## 2022-09-27 NOTE — ED Provider Notes (Signed)
[place] EMERGENCY DEPARTMENT AT [facility] Provider Note   CSN: [id number] Arrival date & time: 09/27/22  1204     History  Chief Complaint  Patient presents with   Kristen Henderson   Emesis   Diarrhea    Kristen Henderson is a 27 y.o. female.   Kristen Henderson Associated symptoms: diarrhea and vomiting   Emesis Associated symptoms: Kristen Henderson and diarrhea   Diarrhea Associated symptoms: Kristen Henderson and vomiting    Patient is a 27 year old female with no pertinent past medical history apart from eczema is present emergency room today with epigastric Kristen Henderson since 4 AM this morning seems that she has had a couple episodes of similar Henderson over the past 6 months but have only lasted for max 2 hours whereas today her symptoms been persistent since 4 AM.  She has had some nausea and 1 episode of nonbloody nonbilious emesis.  Henderson has been colicky but severe.  She has no urinary frequency urgency dysuria hematuria no chest Henderson or difficulty breathing      Home Medications Prior to Admission medications   Medication Sig Start Date End Date Taking? Authorizing Provider  AZO-TABS 95 MG tablet Take 95 mg by mouth 3 (three) times daily as needed for Henderson.   Yes [provider]  bismuth subsalicylate (PEPTO BISMOL) 262 MG/15ML suspension Take 30 mLs by mouth every 6 (six) hours as needed for indigestion or diarrhea or loose stools.   Yes [provider]  calcium carbonate (TUMS EX) 750 MG chewable tablet Chew 1 tablet by mouth 3 (three) times daily as needed for heartburn.   Yes [provider]  fluticasone (FLONASE) 50 MCG/ACT nasal spray Place 2 sprays into both nostrils daily. Patient taking differently: Place 2 sprays into both nostrils daily as needed for allergies or rhinitis. 07/29/17  Yes Saguier, Ramon Dredge, PA-C  Ibuprofen 200 MG CAPS Take 400 mg by mouth every 6 (six) hours as needed (for headaches or Henderson).   Yes  [provider]  OnabotulinumtoxinA (BOTOX IJ) Inject as directed See admin instructions. Botox injections- As directed every 3-4 months for migraines   Yes [provider]  ZYRTEC ALLERGY 10 MG tablet Take 10 mg by mouth daily as needed for allergies or rhinitis.   Yes [provider]  cephALEXin (KEFLEX) 500 MG capsule Take 1 capsule (500 mg total) by mouth 2 (two) times daily. Patient not taking: Reported on 09/27/2022 02/01/18   Saguier, Ramon Dredge, PA-C  predniSONE (DELTASONE) 10 MG tablet 6 TAB PO DAY 1 5 TAB PO DAY 2 4 TAB PO DAY 3 3 TAB PO DAY 4 2 TAB PO DAY 5 1 TAB PO DAY 6 Patient not taking: Reported on 09/27/2022 02/01/18   Saguier, Ramon Dredge, PA-C      Allergies    Macrobid [nitrofurantoin]    Review of Systems   Review of Systems  Gastrointestinal:  Positive for Kristen Henderson, diarrhea and vomiting.    Physical Exam Updated Vital Signs BP 117/82 (BP Location: Left Arm)   Pulse (!) 58   Temp 98.2 F (36.8 C) (Oral)   Resp 14   Ht 5\' 3"  (1.6 m)   Wt 83.5 kg   LMP  (Within Weeks)   SpO2 100%   BMI 32.59 kg/m  Physical Exam Vitals and nursing note reviewed.  Constitutional:      General: She is not in acute distress. HENT:     Head: Normocephalic and atraumatic.  Nose: Nose normal.  Eyes:     General: No scleral icterus. Cardiovascular:     Rate and Rhythm: Normal rate and regular rhythm.     Pulses: Normal pulses.     Heart sounds: Normal heart sounds.  Pulmonary:     Effort: Pulmonary effort is normal. No respiratory distress.     Breath sounds: No wheezing.  Kristen:     Palpations: Abdomen is soft.     Tenderness: There is Kristen tenderness in the right upper quadrant. Positive signs include Murphy's sign.  Musculoskeletal:     Cervical back: Normal range of motion.     Right lower leg: No edema.     Left lower leg: No edema.  Skin:    General: Skin is warm and dry.     Capillary Refill: Capillary refill takes less  than 2 seconds.  Neurological:     Mental Status: She is alert. Mental status is at baseline.  Psychiatric:        Mood and Affect: Mood normal.        Behavior: Behavior normal.     ED Results / Procedures / Treatments   Labs (all labs ordered are listed, but only abnormal results are displayed) Labs Reviewed  COMPREHENSIVE METABOLIC PANEL - Abnormal; Notable for the following components:      Result Value   Potassium 3.4 (*)    Glucose, Bld 107 (*)    Total Protein 8.4 (*)    All other components within normal limits  CBC - Abnormal; Notable for the following components:   WBC 10.8 (*)    All other components within normal limits  URINALYSIS, ROUTINE W REFLEX MICROSCOPIC - Abnormal; Notable for the following components:   Leukocytes,Ua TRACE (*)    All other components within normal limits  LIPASE, BLOOD  HIV ANTIBODY (ROUTINE TESTING W REFLEX)  COMPREHENSIVE METABOLIC PANEL  CBC  I-STAT BETA HCG BLOOD, ED ([facility], [facility], [facility] ONLY)    EKG None  Radiology US Abdomen Limited RUQ (LIVER/GB)  Result Date: 09/27/2022 CLINICAL DATA:  Henderson right upper quadrant EXAM: ULTRASOUND ABDOMEN LIMITED RIGHT UPPER QUADRANT COMPARISON:  None Available. FINDINGS: Gallbladder: There is a large 2.6 cm hyperechoic focus with acoustic shadowing in the dependent portion of neck of the gallbladder. There is no fluid around the gallbladder. There is no significant wall thickening in gallbladder. Technologist did not observe any focal tenderness over the gallbladder. Common bile duct: Diameter: 4.5 mm Liver: No focal lesion identified. Within normal limits in parenchymal echogenicity. Portal vein is patent on color Doppler imaging with normal direction of blood flow towards the liver. Other: None. IMPRESSION: Large gallbladder stone. There are no imaging signs of acute cholecystitis. There is no dilation of bile ducts. Electronically Signed   By: Ernie Avena M.D.   On: 09/27/2022 13:43     Procedures Procedures    Medications Ordered in ED Medications  loratadine (CLARITIN) tablet 10 mg (has no administration in time range)  fluticasone (FLONASE) 50 MCG/ACT nasal spray 2 spray (has no administration in time range)  enoxaparin (LOVENOX) injection 40 mg (has no administration in time range)  dextrose 5 % and 0.45 % NaCl with KCl 20 mEq/L infusion (has no administration in time range)  cefTRIAXone (ROCEPHIN) 2 g in sodium chloride 0.9 % 100 mL IVPB (has no administration in time range)  acetaminophen (TYLENOL) tablet 1,000 mg (has no administration in time range)  ketorolac (TORADOL) 30 MG/ML injection 30 mg (has no administration  in time range)  oxyCODONE (Oxy IR/ROXICODONE) immediate release tablet 5-10 mg (has no administration in time range)  morphine (PF) 2 MG/ML injection 1-4 mg (has no administration in time range)  melatonin tablet 3 mg (has no administration in time range)  diphenhydrAMINE (BENADRYL) capsule 25 mg (has no administration in time range)    Or  diphenhydrAMINE (BENADRYL) injection 25 mg (has no administration in time range)  ondansetron (ZOFRAN-ODT) disintegrating tablet 4 mg (has no administration in time range)    Or  ondansetron (ZOFRAN) injection 4 mg (has no administration in time range)  simethicone (MYLICON) chewable tablet 40 mg (has no administration in time range)  pantoprazole (PROTONIX) EC tablet 40 mg (has no administration in time range)  metoprolol tartrate (LOPRESSOR) injection 5 mg (has no administration in time range)  hydrocortisone cream 1 % (has no administration in time range)  ondansetron (ZOFRAN) injection 4 mg (4 mg Intravenous Given 09/27/22 1306)  morphine (PF) 4 MG/ML injection 4 mg (4 mg Intravenous Given 09/27/22 1309)  sodium chloride 0.9 % bolus 1,000 mL (0 mLs Intravenous Stopped 09/27/22 1501)  famotidine (PEPCID) IVPB 20 mg premix (20 mg Intravenous New Bag/Given 09/27/22 1501)  morphine (PF) 4 MG/ML injection 4 mg (4  mg Intravenous Given 09/27/22 1452)    ED Course/ Medical Decision Making/ A&P Clinical Course as of 09/27/22 1751  Mon Sep 27, 2022  1458 General surgery to see [WF]    Clinical Course User Index [WF] Gailen Shelter, PA                             Medical Decision Making Amount and/or Complexity of Data Reviewed Labs: ordered. Radiology: ordered.  Risk Prescription drug management. Decision regarding hospitalization.   This patient presents to the ED for concern of abd Henderson, this involves a number of treatment options, and is a complaint that carries with it a moderate risk of complications and morbidity. A differential diagnosis was considered for the patient's symptoms which is discussed below:   The causes of generalized Kristen Henderson include but are not limited to AAA, mesenteric ischemia, appendicitis, diverticulitis, DKA, gastritis, gastroenteritis, AMI, nephrolithiasis, pancreatitis, peritonitis, adrenal insufficiency,lead poisoning, iron toxicity, intestinal ischemia, constipation, UTI,SBO/LBO, splenic rupture, biliary disease, IBD, IBS, PUD, or hepatitis.   Co morbidities: Discussed in HPI   Brief History:  Patient is a 27 year old female with no pertinent past medical history apart from eczema is present emergency room today with epigastric Kristen Henderson since 4 AM this morning seems that she has had a couple episodes of similar Henderson over the past 6 months but have only lasted for max 2 hours whereas today her symptoms been persistent since 4 AM.  She has had some nausea and 1 episode of nonbloody nonbilious emesis.  Henderson has been colicky but severe.  She has no urinary frequency urgency dysuria hematuria no chest Henderson or difficulty breathing    EMR reviewed including pt PMHx, past surgical history and past visits to ER.   See HPI for more details   Lab Tests:   I ordered and independently interpreted labs. Labs notable for  Leukocytosis, mild  hypokalemia, otherwise labs normal   Imaging Studies:  Abnormal findings. I personally reviewed all imaging studies. Imaging notable for  Large gallbladder stone  Cardiac Monitoring:  NA NA   Medicines ordered:  I ordered medication including normal saline, morphine, morphine, Zofran, Pepcid for Henderson and nausea Reevaluation of  the patient after these medicines showed that the patient improved I have reviewed the patients home medicines and have made adjustments as needed   Critical Interventions:     Consults/Attending Physician    discussed with general surgery Barnetta Chapel who will evaluate patient   Reevaluation:  After the interventions noted above I re-evaluated patient and found that they have :improved   Social Determinants of Health:      Problem List / ED Course:  General surgery to admit agrees with gallbladder as likely cause of her Henderson.   Dispostion:  After consideration of the diagnostic results and the patients response to treatment, I feel that the patent would benefit from      Final Clinical Impression(s) / ED Diagnoses Final diagnoses:  Cholecystitis    Rx / DC Orders ED Discharge Orders     None         Gailen Shelter, [place] 09/27/22 1752    Maia Plan, MD 09/28/22 [phone]

## 2022-09-27 NOTE — ED Triage Notes (Signed)
Pt c/o sharp RUQ pain and n/v/d starting at 0400.  Pain score 10/10.  Pt reports stretching decreases pain and deep breathing increases pain.

## 2022-09-27 NOTE — ED Notes (Signed)
ED TO INPATIENT HANDOFF REPORT  Name/Age/Gender Kristen Henderson 27 y.o. female  Code Status    Code Status Orders  (From admission, onward)           Start     Ordered   09/27/22 1515  Full code  Continuous       Question:  By:  Answer:  Other   09/27/22 1519           Code Status History     This patient has a current code status but no historical code status.       Home/SNF/Other Home  Chief Complaint Acute cholecystitis [K81.0]  Level of Care/Admitting Diagnosis ED Disposition     ED Disposition  Admit   Condition  --   Comment  Hospital Area: [facility] [[id number]]  Level of Care: Med-Surg [16]  May place patient in observation at [facility] or [facility] if equivalent level of care is available:: No  Covid Evaluation: Asymptomatic - no recent exposure (last 10 days) testing not required  Diagnosis: Acute cholecystitis [575.0.ICD-9-CM]  Admitting Physician: CCS, MD [[id number]]  Attending Physician: CCS, MD [[id number]]  Bed request comments: 3e          Medical History Past Medical History:  Diagnosis Date   Allergy    Back pain    Chronic right shoulder pain    Eczema    Eczema    Environmental allergies     Allergies No Known Allergies  IV Location/Drains/Wounds Patient Lines/Drains/Airways Status     Active Line/Drains/Airways     Name Placement date Placement time Site Days   Peripheral IV 09/27/22 20 G 1" Right Antecubital 09/27/22  1306  Antecubital  less than 1            Labs/Imaging Results for orders placed or performed during the hospital encounter of 09/27/22 (from the past 48 hour(s))  Urinalysis, Routine w reflex microscopic -Urine, Clean Catch     Status: Abnormal   Collection Time: 09/27/22 12:28 PM  Result Value Ref Range   Color, Urine YELLOW YELLOW   APPearance CLEAR CLEAR   Specific Gravity, Urine 1.021 1.005 - 1.030   pH 5.0 5.0 - 8.0   Glucose, UA NEGATIVE NEGATIVE mg/dL   Hgb  urine dipstick NEGATIVE NEGATIVE   Bilirubin Urine NEGATIVE NEGATIVE   Ketones, ur NEGATIVE NEGATIVE mg/dL   Protein, ur NEGATIVE NEGATIVE mg/dL   Nitrite NEGATIVE NEGATIVE   Leukocytes,Ua TRACE (A) NEGATIVE   RBC / HPF 6-10 0 - 5 RBC/hpf   WBC, UA 0-5 0 - 5 WBC/hpf   Bacteria, UA NONE SEEN NONE SEEN   Squamous Epithelial / HPF 6-10 0 - 5 /HPF   Mucus PRESENT     Comment: Performed at [facility], [address] [place], [place] [address]  Lipase, blood     Status: None   Collection Time: 09/27/22  1:00 PM  Result Value Ref Range   Lipase 33 11 - 51 U/L    Comment: Performed at [facility], [address] [place], [place] [address]  Comprehensive metabolic panel     Status: Abnormal   Collection Time: 09/27/22  1:00 PM  Result Value Ref Range   Sodium 136 135 - 145 mmol/L   Potassium 3.4 (L) 3.5 - 5.1 mmol/L   Chloride 102 98 - 111 mmol/L   CO2 24 22 - 32 mmol/L   Glucose, Bld 107 (H) 70 - 99  mg/dL    Comment: Glucose reference range applies only to samples taken after fasting for at least 8 hours.   BUN 13 6 - 20 mg/dL   Creatinine, Ser 1.61 0.44 - 1.00 mg/dL   Calcium 09.6 8.9 - 04.5 mg/dL   Total Protein 8.4 (H) 6.5 - 8.1 g/dL   Albumin 4.4 3.5 - 5.0 g/dL   AST 20 15 - 41 U/L   ALT 14 0 - 44 U/L   Alkaline Phosphatase 57 38 - 126 U/L   Total Bilirubin 0.9 0.3 - 1.2 mg/dL   GFR, Estimated >40 >98 mL/min    Comment: (NOTE) Calculated using the CKD-EPI Creatinine Equation (2021)    Anion gap 10 5 - 15    Comment: Performed at [facility], [address] [place], [place] [address]  CBC     Status: Abnormal   Collection Time: 09/27/22  1:00 PM  Result Value Ref Range   WBC 10.8 (H) 4.0 - 10.5 K/uL   RBC 4.85 3.87 - 5.11 MIL/uL   Hemoglobin 13.7 12.0 - 15.0 g/dL   HCT 78.2 95.6 - 21.3 %   MCV 87.6 80.0 - 100.0 fL   MCH 28.2 26.0 - 34.0 pg   MCHC 32.2 30.0 - 36.0 g/dL   RDW 08.6 57.8 - 46.9 %   Platelets  306 150 - 400 K/uL   nRBC 0.0 0.0 - 0.2 %    Comment: Performed at [facility], [address] [place], [place] [address]  I-Stat beta hCG blood, ED     Status: None   Collection Time: 09/27/22  1:19 PM  Result Value Ref Range   I-stat hCG, quantitative <5.0 <5 mIU/mL   Comment 3            Comment:   GEST. AGE      CONC.  (mIU/mL)   <=1 WEEK        5 - 50     2 WEEKS       50 - 500     3 WEEKS       100 - 10,000     4 WEEKS     1,000 - 30,000        FEMALE AND NON-PREGNANT FEMALE:     LESS THAN 5 mIU/mL    US Abdomen Limited RUQ (LIVER/GB)  Result Date: 09/27/2022 CLINICAL DATA:  Pain right upper quadrant EXAM: ULTRASOUND ABDOMEN LIMITED RIGHT UPPER QUADRANT COMPARISON:  None Available. FINDINGS: Gallbladder: There is a large 2.6 cm hyperechoic focus with acoustic shadowing in the dependent portion of neck of the gallbladder. There is no fluid around the gallbladder. There is no significant wall thickening in gallbladder. Technologist did not observe any focal tenderness over the gallbladder. Common bile duct: Diameter: 4.5 mm Liver: No focal lesion identified. Within normal limits in parenchymal echogenicity. Portal vein is patent on color Doppler imaging with normal direction of blood flow towards the liver. Other: None. IMPRESSION: Large gallbladder stone. There are no imaging signs of acute cholecystitis. There is no dilation of bile ducts. Electronically Signed   By: Ernie Avena M.D.   On: 09/27/2022 13:43    Pending Labs Unresulted Labs (From admission, onward)     Start     Ordered   09/28/22 0500  Comprehensive metabolic panel  Tomorrow morning,   R        09/27/22 1519   09/28/22 0500  CBC  Tomorrow morning,   R  09/27/22 1519   09/27/22 1515  HIV Antibody (routine testing w rflx)  (HIV Antibody (Routine testing w reflex) panel)  Once,   R        09/27/22 1519            Vitals/Pain Today's Vitals   09/27/22 1214 09/27/22 1226  09/27/22 1305 09/27/22 1340  BP: 126/81     Pulse: (!) 59     Resp: 16     Temp: 98.7 F (37.1 C)     TempSrc: Oral     SpO2: 100%     Weight: 184 lb (83.5 kg)     Height: 5\' 3"  (1.6 m)     PainSc:  10-Worst pain ever 6  2     Isolation Precautions No active isolations  Medications Medications  loratadine (CLARITIN) tablet 10 mg (has no administration in time range)  fluticasone (FLONASE) 50 MCG/ACT nasal spray 2 spray (has no administration in time range)  enoxaparin (LOVENOX) injection 40 mg (has no administration in time range)  dextrose 5 % and 0.45 % NaCl with KCl 20 mEq/L infusion (has no administration in time range)  cefTRIAXone (ROCEPHIN) 2 g in sodium chloride 0.9 % 100 mL IVPB (has no administration in time range)  acetaminophen (TYLENOL) tablet 1,000 mg (has no administration in time range)  ketorolac (TORADOL) 30 MG/ML injection 30 mg (has no administration in time range)  oxyCODONE (Oxy IR/ROXICODONE) immediate release tablet 5-10 mg (has no administration in time range)  morphine (PF) 2 MG/ML injection 1-4 mg (has no administration in time range)  melatonin tablet 3 mg (has no administration in time range)  diphenhydrAMINE (BENADRYL) capsule 25 mg (has no administration in time range)    Or  diphenhydrAMINE (BENADRYL) injection 25 mg (has no administration in time range)  ondansetron (ZOFRAN-ODT) disintegrating tablet 4 mg (has no administration in time range)    Or  ondansetron (ZOFRAN) injection 4 mg (has no administration in time range)  simethicone (MYLICON) chewable tablet 40 mg (has no administration in time range)  pantoprazole (PROTONIX) EC tablet 40 mg (has no administration in time range)  metoprolol tartrate (LOPRESSOR) injection 5 mg (has no administration in time range)  hydrocortisone cream 1 % (has no administration in time range)  ondansetron (ZOFRAN) injection 4 mg (4 mg Intravenous Given 09/27/22 1306)  morphine (PF) 4 MG/ML injection 4 mg (4 mg  Intravenous Given 09/27/22 1309)  sodium chloride 0.9 % bolus 1,000 mL (0 mLs Intravenous Stopped 09/27/22 1501)  famotidine (PEPCID) IVPB 20 mg premix (20 mg Intravenous New Bag/Given 09/27/22 1501)  morphine (PF) 4 MG/ML injection 4 mg (4 mg Intravenous Given 09/27/22 1452)    Mobility walks

## 2022-09-28 ENCOUNTER — Observation Stay (HOSPITAL_BASED_OUTPATIENT_CLINIC_OR_DEPARTMENT_OTHER): Payer: Medicaid Other | Admitting: Anesthesiology

## 2022-09-28 ENCOUNTER — Observation Stay (HOSPITAL_COMMUNITY): Payer: Medicaid Other | Admitting: Anesthesiology

## 2022-09-28 ENCOUNTER — Encounter (HOSPITAL_COMMUNITY): Payer: Self-pay

## 2022-09-28 ENCOUNTER — Observation Stay (HOSPITAL_COMMUNITY): Payer: Medicaid Other

## 2022-09-28 ENCOUNTER — Encounter (HOSPITAL_COMMUNITY)
Admission: EM | Disposition: A | Discharge status: Home / Self Care | Payer: Self-pay | Source: Home / Self Care | Attending: Emergency Medicine

## 2022-09-28 ENCOUNTER — Other Ambulatory Visit: Payer: Self-pay

## 2022-09-28 DIAGNOSIS — K8000 Calculus of gallbladder with acute cholecystitis without obstruction: Secondary | ICD-10-CM

## 2022-09-28 DIAGNOSIS — E669 Obesity, unspecified: Secondary | ICD-10-CM | POA: Diagnosis not present

## 2022-09-28 DIAGNOSIS — Z6832 Body mass index (BMI) 32.0-32.9, adult: Secondary | ICD-10-CM | POA: Diagnosis not present

## 2022-09-28 HISTORY — PX: CHOLECYSTECTOMY: SHX55

## 2022-09-28 LAB — CBC
HCT: 37.5 % (ref 36.0–46.0)
Hemoglobin: 12.1 g/dL (ref 12.0–15.0)
MCH: 28.5 pg (ref 26.0–34.0)
MCHC: 32.3 g/dL (ref 30.0–36.0)
MCV: 88.2 fL (ref 80.0–100.0)
Platelets: 275 10*3/uL (ref 150–400)
RBC: 4.25 MIL/uL (ref 3.87–5.11)
RDW: 12.1 % (ref 11.5–15.5)
WBC: 7.7 10*3/uL (ref 4.0–10.5)
nRBC: 0 % (ref 0.0–0.2)

## 2022-09-28 LAB — COMPREHENSIVE METABOLIC PANEL
ALT: 10 U/L (ref 0–44)
AST: 15 U/L (ref 15–41)
Albumin: 3.2 g/dL — ABNORMAL LOW (ref 3.5–5.0)
Alkaline Phosphatase: 46 U/L (ref 38–126)
Anion gap: 5 (ref 5–15)
BUN: 8 mg/dL (ref 6–20)
CO2: 25 mmol/L (ref 22–32)
Calcium: 8.5 mg/dL — ABNORMAL LOW (ref 8.9–10.3)
Chloride: 106 mmol/L (ref 98–111)
Creatinine, Ser: 0.56 mg/dL (ref 0.44–1.00)
GFR, Estimated: >60 mL/min (ref >60)
Glucose, Bld: 108 mg/dL — ABNORMAL HIGH (ref 70–99)
Potassium: 3.6 mmol/L (ref 3.5–5.1)
Sodium: 136 mmol/L (ref 135–145)
Total Bilirubin: 0.6 mg/dL (ref 0.3–1.2)
Total Protein: 6.3 g/dL — ABNORMAL LOW (ref 6.5–8.1)

## 2022-09-28 LAB — SURGICAL PCR SCREEN
MRSA, PCR: NEGATIVE
Staphylococcus aureus: POSITIVE — AB

## 2022-09-28 LAB — HIV ANTIBODY (ROUTINE TESTING W REFLEX): HIV Screen 4th Generation wRfx: NONREACTIVE

## 2022-09-28 SURGERY — LAPAROSCOPIC CHOLECYSTECTOMY WITH INTRAOPERATIVE CHOLANGIOGRAM
Anesthesia: General

## 2022-09-28 MED ORDER — BUPIVACAINE HCL (PF) 0.5 % IJ SOLN
INTRAMUSCULAR | Status: AC
  Filled 2022-09-28: qty 30

## 2022-09-28 MED ORDER — SCOPOLAMINE 1 MG/3DAYS TD PT72
1.0000 | MEDICATED_PATCH | Freq: Once | TRANSDERMAL | Status: DC
  Administered 2022-09-28: 1.5 mg via TRANSDERMAL

## 2022-09-28 MED ORDER — ONDANSETRON HCL 4 MG/2ML IJ SOLN
INTRAMUSCULAR | Status: AC
  Filled 2022-09-28: qty 2

## 2022-09-28 MED ORDER — ROCURONIUM BROMIDE 10 MG/ML (PF) SYRINGE
PREFILLED_SYRINGE | INTRAVENOUS | Status: DC | PRN
  Administered 2022-09-28: 40 mg via INTRAVENOUS

## 2022-09-28 MED ORDER — TRAMADOL HCL 50 MG PO TABS: 50.0000 mg | ORAL_TABLET | Freq: Four times a day (QID) | ORAL | Status: DC | PRN

## 2022-09-28 MED ORDER — SCOPOLAMINE 1 MG/3DAYS TD PT72
MEDICATED_PATCH | TRANSDERMAL | Status: AC
  Filled 2022-09-28: qty 1

## 2022-09-28 MED ORDER — FENTANYL CITRATE PF 50 MCG/ML IJ SOSY
PREFILLED_SYRINGE | INTRAMUSCULAR | Status: AC
  Filled 2022-09-28: qty 1

## 2022-09-28 MED ORDER — PHENYLEPHRINE 80 MCG/ML (10ML) SYRINGE FOR IV PUSH (FOR BLOOD PRESSURE SUPPORT)
PREFILLED_SYRINGE | INTRAVENOUS | Status: AC
  Filled 2022-09-28: qty 10

## 2022-09-28 MED ORDER — ACETAMINOPHEN 10 MG/ML IV SOLN
1000.0000 mg | Freq: Once | INTRAVENOUS | Status: DC | PRN
  Administered 2022-09-28: 1000 mg via INTRAVENOUS

## 2022-09-28 MED ORDER — IOHEXOL 300 MG/ML  SOLN
INTRAMUSCULAR | Status: DC | PRN
  Administered 2022-09-28: 10 mL

## 2022-09-28 MED ORDER — MIDAZOLAM HCL 2 MG/2ML IJ SOLN
INTRAMUSCULAR | Status: DC | PRN
  Administered 2022-09-28: 2 mg via INTRAVENOUS

## 2022-09-28 MED ORDER — OXYCODONE HCL 5 MG PO TABS: 5.0000 mg | ORAL_TABLET | Freq: Once | ORAL | Status: DC | PRN

## 2022-09-28 MED ORDER — HYDROMORPHONE HCL 1 MG/ML IJ SOLN: 1.0000 mg | INTRAMUSCULAR | Status: DC | PRN

## 2022-09-28 MED ORDER — LABETALOL HCL 5 MG/ML IV SOLN
INTRAVENOUS | Status: AC
  Filled 2022-09-28: qty 4

## 2022-09-28 MED ORDER — OXYCODONE HCL 5 MG/5ML PO SOLN: 5.0000 mg | Freq: Once | ORAL | Status: DC | PRN

## 2022-09-28 MED ORDER — SODIUM CHLORIDE 0.45 % IV SOLN: INTRAVENOUS | Status: DC

## 2022-09-28 MED ORDER — OXYCODONE HCL 5 MG PO TABS: 5.0000 mg | ORAL_TABLET | ORAL | 0 refills | Status: DC | PRN

## 2022-09-28 MED ORDER — ONDANSETRON 4 MG PO TBDP: 4.0000 mg | ORAL_TABLET | Freq: Four times a day (QID) | ORAL | Status: DC | PRN

## 2022-09-28 MED ORDER — ACETAMINOPHEN 10 MG/ML IV SOLN
INTRAVENOUS | Status: AC
  Filled 2022-09-28: qty 100

## 2022-09-28 MED ORDER — SUGAMMADEX SODIUM 200 MG/2ML IV SOLN
INTRAVENOUS | Status: DC | PRN
  Administered 2022-09-28: 200 mg via INTRAVENOUS

## 2022-09-28 MED ORDER — DEXAMETHASONE SODIUM PHOSPHATE 10 MG/ML IJ SOLN
INTRAMUSCULAR | Status: DC | PRN
  Administered 2022-09-28: 4 mg via INTRAVENOUS

## 2022-09-28 MED ORDER — ROCURONIUM BROMIDE 10 MG/ML (PF) SYRINGE
PREFILLED_SYRINGE | INTRAVENOUS | Status: AC
  Filled 2022-09-28: qty 10

## 2022-09-28 MED ORDER — ACETAMINOPHEN 500 MG PO TABS: 1000.0000 mg | ORAL_TABLET | Freq: Four times a day (QID) | ORAL | 0 refills | Status: DC | PRN

## 2022-09-28 MED ORDER — FENTANYL CITRATE (PF) 100 MCG/2ML IJ SOLN
INTRAMUSCULAR | Status: AC
  Filled 2022-09-28: qty 2

## 2022-09-28 MED ORDER — FENTANYL CITRATE PF 50 MCG/ML IJ SOSY
25.0000 ug | PREFILLED_SYRINGE | INTRAMUSCULAR | Status: DC | PRN
  Administered 2022-09-28: 50 ug via INTRAVENOUS

## 2022-09-28 MED ORDER — LIDOCAINE HCL (PF) 2 % IJ SOLN
INTRAMUSCULAR | Status: AC
  Filled 2022-09-28: qty 5

## 2022-09-28 MED ORDER — ONDANSETRON HCL 4 MG/2ML IJ SOLN: 4.0000 mg | Freq: Four times a day (QID) | INTRAMUSCULAR | Status: DC | PRN

## 2022-09-28 MED ORDER — LACTATED RINGERS IR SOLN
Status: DC | PRN
  Administered 2022-09-28: 1000 mL

## 2022-09-28 MED ORDER — ONDANSETRON HCL 4 MG/2ML IJ SOLN
INTRAMUSCULAR | Status: DC | PRN
  Administered 2022-09-28: 4 mg via INTRAVENOUS

## 2022-09-28 MED ORDER — PROMETHAZINE HCL 25 MG/ML IJ SOLN: 6.2500 mg | INTRAMUSCULAR | Status: DC | PRN

## 2022-09-28 MED ORDER — MIDAZOLAM HCL 2 MG/2ML IJ SOLN
INTRAMUSCULAR | Status: AC
  Filled 2022-09-28: qty 2

## 2022-09-28 MED ORDER — LIDOCAINE 2% (20 MG/ML) 5 ML SYRINGE
INTRAMUSCULAR | Status: DC | PRN
  Administered 2022-09-28: 100 mg via INTRAVENOUS

## 2022-09-28 MED ORDER — FENTANYL CITRATE (PF) 100 MCG/2ML IJ SOLN
INTRAMUSCULAR | Status: DC | PRN
  Administered 2022-09-28: 100 ug via INTRAVENOUS

## 2022-09-28 MED ORDER — LACTATED RINGERS IV SOLN: INTRAVENOUS | Status: DC

## 2022-09-28 MED ORDER — PROPOFOL 10 MG/ML IV BOLUS
INTRAVENOUS | Status: AC
  Filled 2022-09-28: qty 20

## 2022-09-28 MED ORDER — SUCCINYLCHOLINE CHLORIDE 200 MG/10ML IV SOSY
PREFILLED_SYRINGE | INTRAVENOUS | Status: DC | PRN
  Administered 2022-09-28: 100 mg via INTRAVENOUS

## 2022-09-28 MED ORDER — CHLORHEXIDINE GLUCONATE CLOTH 2 % EX PADS
6.0000 | MEDICATED_PAD | Freq: Every day | CUTANEOUS | Status: DC
  Administered 2022-09-28: 6 via TOPICAL

## 2022-09-28 MED ORDER — ACETAMINOPHEN 325 MG PO TABS: 650.0000 mg | ORAL_TABLET | Freq: Four times a day (QID) | ORAL | Status: DC | PRN

## 2022-09-28 MED ORDER — ACETAMINOPHEN 650 MG RE SUPP: 650.0000 mg | Freq: Four times a day (QID) | RECTAL | Status: DC | PRN

## 2022-09-28 MED ORDER — MUPIROCIN 2 % EX OINT
1.0000 | TOPICAL_OINTMENT | Freq: Two times a day (BID) | CUTANEOUS | Status: DC
  Administered 2022-09-28: 1 via NASAL
  Filled 2022-09-28: qty 22

## 2022-09-28 MED ORDER — OXYCODONE HCL 5 MG PO TABS: 5.0000 mg | ORAL_TABLET | ORAL | Status: DC | PRN

## 2022-09-28 MED ORDER — DEXAMETHASONE SODIUM PHOSPHATE 10 MG/ML IJ SOLN
INTRAMUSCULAR | Status: AC
  Filled 2022-09-28: qty 1

## 2022-09-28 MED ORDER — PHENYLEPHRINE 80 MCG/ML (10ML) SYRINGE FOR IV PUSH (FOR BLOOD PRESSURE SUPPORT)
PREFILLED_SYRINGE | INTRAVENOUS | Status: DC | PRN
  Administered 2022-09-28: 80 ug via INTRAVENOUS

## 2022-09-28 MED ORDER — 0.9 % SODIUM CHLORIDE (POUR BTL) OPTIME
TOPICAL | Status: DC | PRN
  Administered 2022-09-28: 1000 mL

## 2022-09-28 MED ORDER — BUPIVACAINE HCL 0.5 % IJ SOLN
INTRAMUSCULAR | Status: DC | PRN
  Administered 2022-09-28: 30 mL

## 2022-09-28 MED ORDER — PROPOFOL 10 MG/ML IV BOLUS
INTRAVENOUS | Status: DC | PRN
  Administered 2022-09-28: 200 mg via INTRAVENOUS

## 2022-09-28 MED ORDER — LIP MEDEX EX OINT
TOPICAL_OINTMENT | CUTANEOUS | Status: DC | PRN
  Filled 2022-09-28: qty 7

## 2022-09-28 MED ORDER — SUCCINYLCHOLINE CHLORIDE 200 MG/10ML IV SOSY
PREFILLED_SYRINGE | INTRAVENOUS | Status: AC
  Filled 2022-09-28: qty 10

## 2022-09-28 MED ORDER — ACETAMINOPHEN 500 MG PO TABS
1000.0000 mg | ORAL_TABLET | Freq: Four times a day (QID) | ORAL | Status: DC
  Filled 2022-09-28: qty 2

## 2022-09-28 SURGICAL SUPPLY — 41 items
ADH SKN CLS APL DERMABOND .7 (GAUZE/BANDAGES/DRESSINGS) ×1
APL PRP STRL LF DISP 70% ISPRP (MISCELLANEOUS) ×2
APPLIER CLIP ROT 10 11.4 M/L (STAPLE) ×1
BAG COUNTER SPONGE SURGICOUNT (BAG) IMPLANT
BAG SPNG CNTER NS LX DISP (BAG)
CABLE HIGH FREQUENCY MONO STRZ (ELECTRODE) ×1 IMPLANT
CHLORAPREP W/TINT 26 (MISCELLANEOUS) ×2 IMPLANT
CLIP APPLIE ROT 10 11.4 M/L (STAPLE) ×1 IMPLANT
COVER MAYO STAND XLG (MISCELLANEOUS) ×1 IMPLANT
COVER SURGICAL LIGHT HANDLE (MISCELLANEOUS) ×1 IMPLANT
DERMABOND ADVANCED .7 DNX12 (GAUZE/BANDAGES/DRESSINGS) IMPLANT
DRAPE C-ARM 42X120 X-RAY (DRAPES) ×1 IMPLANT
ELECT REM PT RETURN 15FT ADLT (MISCELLANEOUS) ×1 IMPLANT
GAUZE SPONGE 2X2 8PLY STRL LF (GAUZE/BANDAGES/DRESSINGS) ×1 IMPLANT
GLOVE SURG ORTHO 8.0 STRL STRW (GLOVE) ×1 IMPLANT
GLOVE SURG SYN 7.5 E (GLOVE) ×2 IMPLANT
GLOVE SURG SYN 7.5 PF PI (GLOVE) ×2 IMPLANT
GOWN STRL REUS W/ TWL XL LVL3 (GOWN DISPOSABLE) ×2 IMPLANT
GOWN STRL REUS W/TWL XL LVL3 (GOWN DISPOSABLE) ×2
HEMOSTAT SURGICEL 4X8 (HEMOSTASIS) IMPLANT
IRRIG SUCT STRYKERFLOW 2 WTIP (MISCELLANEOUS) ×1
IRRIGATION SUCT STRKRFLW 2 WTP (MISCELLANEOUS) ×1 IMPLANT
KIT BASIN OR (CUSTOM PROCEDURE TRAY) ×1 IMPLANT
KIT TURNOVER KIT A (KITS) IMPLANT
PENCIL SMOKE EVACUATOR (MISCELLANEOUS) IMPLANT
SCISSORS LAP 5X35 DISP (ENDOMECHANICALS) ×1 IMPLANT
SET CHOLANGIOGRAPH MIX (MISCELLANEOUS) ×1 IMPLANT
SET TUBE SMOKE EVAC HIGH FLOW (TUBING) IMPLANT
SLEEVE Z-THREAD 5X100MM (TROCAR) ×1 IMPLANT
SPIKE FLUID TRANSFER (MISCELLANEOUS) ×1 IMPLANT
STRIP CLOSURE SKIN 1/2X4 (GAUZE/BANDAGES/DRESSINGS) IMPLANT
SUT MNCRL AB 4-0 PS2 18 (SUTURE) ×1 IMPLANT
SUT VICRYL 0 UR6 27IN ABS (SUTURE) IMPLANT
SYS BAG RETRIEVAL 10MM (BASKET) ×1
SYSTEM BAG RETRIEVAL 10MM (BASKET) ×1 IMPLANT
TOWEL OR 17X26 10 PK STRL BLUE (TOWEL DISPOSABLE) ×1 IMPLANT
TOWEL OR NON WOVEN STRL DISP B (DISPOSABLE) ×1 IMPLANT
TRAY LAPAROSCOPIC (CUSTOM PROCEDURE TRAY) ×1 IMPLANT
TROCAR 11X100 Z THREAD (TROCAR) ×1 IMPLANT
TROCAR BALLN 12MMX100 BLUNT (TROCAR) ×1 IMPLANT
TROCAR Z-THREAD OPTICAL 5X100M (TROCAR) ×1 IMPLANT

## 2022-09-28 NOTE — Progress Notes (Signed)
Discharge instructions given to patient and all questions were answered.  

## 2022-09-28 NOTE — Anesthesia Preprocedure Evaluation (Addendum)
Anesthesia Evaluation  Patient identified by MRN, date of birth, ID band Patient awake    Reviewed: Allergy & Precautions, NPO status , Patient's Chart, lab work & pertinent test results  History of Anesthesia Complications Negative for: history of anesthetic complications  Airway Mallampati: II  TM Distance: >3 FB Neck ROM: Full    Dental  (+) Dental Advisory Given   Pulmonary neg pulmonary ROS   Pulmonary exam normal breath sounds clear to auscultation       Cardiovascular negative cardio ROS  Rhythm:Regular Rate:Normal     Neuro/Psych negative neurological ROS     GI/Hepatic Neg liver ROS,neg GERD  ,,cholecystitis   Endo/Other  negative endocrine ROS    Renal/GU negative Renal ROS     Musculoskeletal   Abdominal  (+) + obese  Peds  Hematology negative hematology ROS (+)   Anesthesia Other Findings   Reproductive/Obstetrics                             Anesthesia Physical Anesthesia Plan  ASA: 2  Anesthesia Plan: General   Post-op Pain Management:    Induction: Intravenous and Rapid sequence  PONV Risk Score and Plan: 3 and Ondansetron, Dexamethasone, Treatment may vary due to age or medical condition and Scopolamine patch - Pre-op  Airway Management Planned: Oral ETT  Additional Equipment:   Intra-op Plan:   Post-operative Plan: Extubation in OR  Informed Consent: I have reviewed the patients History and Physical, chart, labs and discussed the procedure including the risks, benefits and alternatives for the proposed anesthesia with the patient or authorized representative who has indicated his/her understanding and acceptance.     Dental advisory given  Plan Discussed with: CRNA and Anesthesiologist  Anesthesia Plan Comments: (Risks of general anesthesia discussed including, but not limited to, sore throat, hoarse voice, chipped/damaged teeth, injury to vocal cords,  nausea and vomiting, allergic reactions, lung infection, heart attack, stroke, and death. All questions answered. )        Anesthesia Quick Evaluation

## 2022-09-28 NOTE — Interval H&P Note (Signed)
History and Physical Interval Note:  09/28/2022 10:43 AM  Kristen Henderson  has presented today for surgery, with the diagnosis of CHOLECYSTITIS.  The various methods of treatment have been discussed with the patient and family. After consideration of risks, benefits and other options for treatment, the patient has consented to    Procedure(s): LAPAROSCOPIC CHOLECYSTECTOMY WITH POSSIBLE INTRAOPERATIVE CHOLANGIOGRAM (N/A) as a surgical intervention.    The patient's history has been reviewed, patient examined, no change in status, stable for surgery.  I have reviewed the patient's chart and labs.  Questions were answered to the patient's satisfaction.    Darnell Level, MD [facility] practice Office: [phone]   Darnell Level

## 2022-09-28 NOTE — Transfer of Care (Signed)
Immediate Anesthesia Transfer of Care Note  Patient: Kristen Henderson  Procedure(s) Performed: LAPAROSCOPIC CHOLECYSTECTOMY WITH INTRAOPERATIVE CHOLANGIOGRAM  Patient Location: PACU  Anesthesia Type:General  Level of Consciousness: drowsy  Airway & Oxygen Therapy: Patient Spontanous Breathing and Patient connected to face mask oxygen  Post-op Assessment: Report given to RN, Post -op Vital signs reviewed and stable, and Patient moving all extremities X 4  Post vital signs: Reviewed and stable  Last Vitals:  Vitals Value Taken Time  BP 114/62   Temp    Pulse 66   Resp 12   SpO2 100     Last Pain:  Vitals:   09/28/22 1022  TempSrc:   PainSc: 0-No pain      Patients Stated Pain Goal: 0 (09/27/22 2030)  Complications: No notable events documented.

## 2022-09-28 NOTE — Anesthesia Procedure Notes (Signed)
Procedure Name: Intubation Date/Time: 09/28/2022 11:19 AM  Performed by: Nelle Don, CRNAPre-anesthesia Checklist: Patient identified, Emergency Drugs available, Suction available and Patient being monitored Patient Re-evaluated:Patient Re-evaluated prior to induction Oxygen Delivery Method: Circle system utilized Preoxygenation: Pre-oxygenation with 100% oxygen Induction Type: IV induction, Cricoid Pressure applied and Rapid sequence Laryngoscope Size: Mac and 4 Grade View: Grade I Tube type: Oral Tube size: 7.0 mm Number of attempts: 1 Airway Equipment and Method: Stylet Placement Confirmation: positive ETCO2, ETT inserted through vocal cords under direct vision and breath sounds checked- equal and bilateral Secured at: 22 cm Tube secured with: Tape Dental Injury: Teeth and Oropharynx as per pre-operative assessment

## 2022-09-28 NOTE — Anesthesia Postprocedure Evaluation (Signed)
Anesthesia Post Note  Patient: Kristen Henderson  Procedure(s) Performed: LAPAROSCOPIC CHOLECYSTECTOMY WITH INTRAOPERATIVE CHOLANGIOGRAM     Patient location during evaluation: PACU Anesthesia Type: General Level of consciousness: awake Pain management: pain level controlled Vital Signs Assessment: post-procedure vital signs reviewed and stable Respiratory status: spontaneous breathing, nonlabored ventilation and respiratory function stable Cardiovascular status: blood pressure returned to baseline and stable Postop Assessment: no apparent nausea or vomiting Anesthetic complications: no   No notable events documented.  Last Vitals:  Vitals:   09/28/22 1300 09/28/22 1315  BP: 105/60 105/64  Pulse: 66 68  Resp: 16 17  Temp:  36.5 C  SpO2: 100% 99%    Last Pain:  Vitals:   09/28/22 1300  TempSrc:   PainSc: 3                  Linton Rump

## 2022-09-28 NOTE — Discharge Instructions (Signed)
[facility] ° °Please arrive at least 30 min before your appointment to complete your check in paperwork.  If you are unable to arrive 30 min prior to your appointment time we may have to cancel or reschedule you. °LAPAROSCOPIC SURGERY: POST OP INSTRUCTIONS °Always review your discharge instruction sheet given to you by the facility where your surgery was performed. °IF YOU HAVE DISABILITY OR FAMILY LEAVE FORMS, YOU MUST BRING THEM TO THE OFFICE FOR PROCESSING.   °DO NOT GIVE THEM TO YOUR DOCTOR. ° °PAIN CONTROL ° °First take acetaminophen (Tylenol) AND/or ibuprofen (Advil) to control your pain after surgery.  Follow directions on package.  Taking acetaminophen (Tylenol) and/or ibuprofen (Advil) regularly after surgery will help to control your pain and lower the amount of prescription pain medication you may need.  You should not take more than 4,000 mg (4 grams) of acetaminophen (Tylenol) in 24 hours.  You should not take ibuprofen (Advil), aleve, motrin, naprosyn or other NSAIDS if you have a history of stomach ulcers or chronic kidney disease.  °A prescription for pain medication may be given to you upon discharge.  Take your pain medication as prescribed, if you still have uncontrolled pain after taking acetaminophen (Tylenol) or ibuprofen (Advil). °Use ice packs to help control pain. °If you need a refill on your pain medication, please contact your pharmacy.  They will contact our office to request authorization. Prescriptions will not be filled after 5pm or on week-ends. ° °HOME MEDICATIONS °Take your usually prescribed medications unless otherwise directed. ° °DIET °You should follow a light diet the first few days after arrival home.  Be sure to include lots of fluids daily. Avoid fatty, fried foods.  ° °CONSTIPATION °It is common to experience some constipation after surgery and if you are taking pain medication.  Increasing fluid intake and taking a stool softener (such as Colace)  will usually help or prevent this problem from occurring.  A mild laxative (Milk of Magnesia or Miralax) should be taken according to package instructions if there are no bowel movements after 48 hours. ° °WOUND/INCISION CARE °Most patients will experience some swelling and bruising in the area of the incisions.  Ice packs will help.  Swelling and bruising can take several days to resolve.  °Unless discharge instructions indicate otherwise, follow guidelines below  °STERI-STRIPS - you may remove your outer bandages 48 hours after surgery, and you may shower at that time.  You have steri-strips (small skin tapes) in place directly over the incision.  These strips should be left on the skin for 7-10 days.   °DERMABOND/SKIN GLUE - you may shower in 24 hours.  The glue will flake off over the next 2-3 weeks. °Any sutures or staples will be removed at the office during your follow-up visit. ° °ACTIVITIES °You may resume regular (light) daily activities beginning the next day--such as daily self-care, walking, climbing stairs--gradually increasing activities as tolerated.  You may have sexual intercourse when it is comfortable.  Refrain from any heavy lifting or straining until approved by your doctor. °You may drive when you are no longer taking prescription pain medication, you can comfortably wear a seatbelt, and you can safely maneuver your car and apply brakes. ° °FOLLOW-UP °You should see your doctor in the office for a follow-up appointment approximately 2-3 weeks after your surgery.  You should have been given your post-op/follow-up appointment when your surgery was scheduled.  If you did not receive a post-op/follow-up appointment, make sure   that you call for this appointment within a day or two after you arrive home to insure a convenient appointment time. ° ° °WHEN TO CALL YOUR DOCTOR: °Fever over 101.0 °Inability to urinate °Continued bleeding from incision. °Increased pain, redness, or drainage from the  incision. °Increasing abdominal pain ° °The clinic staff is available to answer your questions during regular business hours.  Please don’t hesitate to call and ask to speak to one of the nurses for clinical concerns.  If you have a medical emergency, go to the nearest emergency room or call 911.  A surgeon from [facility] is always on call at the hospital. °[address], [address], [place], [place]  [address] ? [address] [address], [place], [place]   [address] °[phone] ? [phone] ? FAX [phone] ° ° ° ° °Managing Your Pain After Surgery Without Opioids ° ° ° °Thank you for participating in our program to help patients manage their pain after surgery without opioids. This is part of our effort to provide you with the best care possible, without exposing you or your family to the risk that opioids pose. ° °What pain can I expect after surgery? °You can expect to have some pain after surgery. This is normal. The pain is typically worse the day after surgery, and quickly begins to get better. °Many studies have found that many patients are able to manage their pain after surgery with Over-the-Counter (OTC) medications such as Tylenol and Motrin. If you have a condition that does not allow you to take Tylenol or Motrin, notify your surgical team. ° °How will I manage my pain? °The best strategy for controlling your pain after surgery is around the clock pain control with Tylenol (acetaminophen) and Motrin (ibuprofen or Advil). Alternating these medications with each other allows you to maximize your pain control. In addition to Tylenol and Motrin, you can use heating pads or ice packs on your incisions to help reduce your pain. ° °How will I alternate your regular strength over-the-counter pain medication? °You will take a dose of pain medication every three hours. °Start by taking 650 mg of Tylenol (2 pills of 325 mg) °3 hours later take 600 mg of Motrin (3 pills of 200 mg) °3 hours after  taking the Motrin take 650 mg of Tylenol °3 hours after that take 600 mg of Motrin. ° ° °- 1 - ° °See example - if your first dose of Tylenol is at 12:00 PM ° ° °12:00 PM Tylenol 650 mg (2 pills of 325 mg)  °3:00 PM Motrin 600 mg (3 pills of 200 mg)  °6:00 PM Tylenol 650 mg (2 pills of 325 mg)  °9:00 PM Motrin 600 mg (3 pills of 200 mg)  °Continue alternating every 3 hours  ° °We recommend that you follow this schedule around-the-clock for at least 3 days after surgery, or until you feel that it is no longer needed. Use the table on the last page of this handout to keep track of the medications you are taking. °Important: °Do not take more than 3000mg of Tylenol or 3200mg of Motrin in a 24-hour period. °Do not take ibuprofen/Motrin if you have a history of bleeding stomach ulcers, severe kidney disease, &/or actively taking a blood thinner ° °What if I still have pain? °If you have pain that is not controlled with the over-the-counter pain medications (Tylenol and Motrin or Advil) you might have what we call “breakthrough” pain. You will receive a prescription   for a small amount of an opioid pain medication such as Oxycodone, Tramadol, or Tylenol with Codeine. Use these opioid pills in the first 24 hours after surgery if you have breakthrough pain. Do not take more than 1 pill every 4-6 hours. ° °If you still have uncontrolled pain after using all opioid pills, don't hesitate to call our staff using the number provided. We will help make sure you are managing your pain in the best way possible, and if necessary, we can provide a prescription for additional pain medication. ° ° °Day 1   ° °Time  °Name of Medication Number of pills taken  °Amount of Acetaminophen  °Pain Level  ° °Comments  °AM PM       °AM PM       °AM PM       °AM PM       °AM PM       °AM PM       °AM PM       °AM PM       °Total Daily amount of Acetaminophen °Do not take more than  3,000 mg per day    ° ° °Day 2   ° °Time  °Name of Medication  Number of pills °taken  °Amount of Acetaminophen  °Pain Level  ° °Comments  °AM PM       °AM PM       °AM PM       °AM PM       °AM PM       °AM PM       °AM PM       °AM PM       °Total Daily amount of Acetaminophen °Do not take more than  3,000 mg per day    ° ° °Day 3   ° °Time  °Name of Medication Number of pills taken  °Amount of Acetaminophen  °Pain Level  ° °Comments  °AM PM       °AM PM       °AM PM       °AM PM       ° ° ° °AM PM       °AM PM       °AM PM       °AM PM       °Total Daily amount of Acetaminophen °Do not take more than  3,000 mg per day    ° ° °Day 4   ° °Time  °Name of Medication Number of pills taken  °Amount of Acetaminophen  °Pain Level  ° °Comments  °AM PM       °AM PM       °AM PM       °AM PM       °AM PM       °AM PM       °AM PM       °AM PM       °Total Daily amount of Acetaminophen °Do not take more than  3,000 mg per day    ° ° °Day 5   ° °Time  °Name of Medication Number °of pills taken  °Amount of Acetaminophen  °Pain Level  ° °Comments  °AM PM       °AM PM       °AM PM       °AM PM       °AM PM       °AM   PM       °AM PM       °AM PM       °Total Daily amount of Acetaminophen °Do not take more than  3,000 mg per day    ° ° ° °Day 6   ° °Time  °Name of Medication Number of pills °taken  °Amount of Acetaminophen  °Pain Level  °Comments  °AM PM       °AM PM       °AM PM       °AM PM       °AM PM       °AM PM       °AM PM       °AM PM       °Total Daily amount of Acetaminophen °Do not take more than  3,000 mg per day    ° ° °Day 7   ° °Time  °Name of Medication Number of pills taken  °Amount of Acetaminophen  °Pain Level  ° °Comments  °AM PM       °AM PM       °AM PM       °AM PM       °AM PM       °AM PM       °AM PM       °AM PM       °Total Daily amount of Acetaminophen °Do not take more than  3,000 mg per day    ° ° ° ° °For additional information about how and where to safely dispose of unused opioid °medications - https://www.morepowerfulnc.org ° °Disclaimer: This document  contains information and/or instructional materials adapted from [facility] for the typical patient with your condition. It does not replace medical advice from your health care provider because your experience may differ from that of the °typical patient. Talk to your health care provider if you have any questions about this °document, your condition or your treatment plan. °Adapted from [facility] ° °

## 2022-09-28 NOTE — Discharge Summary (Signed)
Patient ID: Kristen Henderson [id number] 11/20/1995 27 y.o.  Admit date: 09/27/2022 Discharge date: 09/28/2022  Admitting Diagnosis: cholecystitis  Discharge Diagnosis Patient Active Problem List   Diagnosis Date Noted   Acute cholecystitis 09/27/2022  S/p lap chole with IOC  Consultants none  Reason for Admission: This is a pleasant 27 yo female who has eczema and seasonal allergies who has been having some intermittent epigastric and RUQ abdominal pain for the last 6 months. She thought it was secondary to reflux. About 6 months ago, she ate red meat for the first time in years and thought that may have contributed. Her pain went away in about 2 hours. She did have some nausea and vomiting. Intermittently since then, she has had small incidents, and as above, thought this was reflux. She had a chicken wrap last night and woke up around 0400 this morning with severe epigastric and RUQ abdominal pain. She had some N/V. She took reflux medication this morning, but this made no difference in her symptoms. She denies diarrhea, fevers, CP, SOB, but some pain radiating to her back. She was seen at the urgent care who suggested she come to the ED. Upon arrival, she has been found to have a large 2.6cm gallstone around the neck of the gallbladder with no other signs of cholecystitis. Her LFTs are normal and WBC is 10.8. Her pain persists c/w early cholecystitis. We have been asked to see her for further evaluation and recommendations.   Procedures Lap chole with IOC, Dr. Gerrit Friends 09/28/22  Hospital Course:  The patient was admitted and underwent a laparoscopic cholecystectomy with IOC.  The patient tolerated the procedure well.  On POD 0, the patient was tolerating a regular diet, voiding well, mobilizing, and pain was controlled with oral pain medications.  The patient was stable for DC home at this time with appropriate follow up made.   Physical Exam: Abd: soft, appropriately  tender, +BS, ND, incisions c/d/i  Allergies as of 09/28/2022       Reactions   Macrobid [nitrofurantoin] Nausea Only        Medication List     STOP taking these medications    cephALEXin 500 MG capsule Commonly known as: KEFLEX   predniSONE 10 MG tablet Commonly known as: DELTASONE       TAKE these medications    acetaminophen 500 MG tablet Commonly known as: TYLENOL Take 2 tablets (1,000 mg total) by mouth every 6 (six) hours as needed.   AZO-TABS 95 MG tablet Generic drug: phenazopyridine Take 95 mg by mouth 3 (three) times daily as needed for pain.   bismuth subsalicylate 262 MG/15ML suspension Commonly known as: PEPTO BISMOL Take 30 mLs by mouth every 6 (six) hours as needed for indigestion or diarrhea or loose stools.   BOTOX IJ Inject as directed See admin instructions. Botox injections- As directed every 3-4 months for migraines   calcium carbonate 750 MG chewable tablet Commonly known as: TUMS EX Chew 1 tablet by mouth 3 (three) times daily as needed for heartburn.   fluticasone 50 MCG/ACT nasal spray Commonly known as: FLONASE Place 2 sprays into both nostrils daily. What changed:  when to take this reasons to take this   Ibuprofen 200 MG Caps Take 400 mg by mouth every 6 (six) hours as needed (for headaches or pain).   oxyCODONE 5 MG immediate release tablet Commonly known as: Oxy IR/ROXICODONE Take 1 tablet (5 mg total) by mouth every 4 (four) hours as  needed for moderate pain.   ZyrTEC Allergy 10 MG tablet Generic drug: cetirizine Take 10 mg by mouth daily as needed for allergies or rhinitis.          Follow-up Information     Maczis, Hedda Slade, PA-C Follow up on 10/14/2022.   Specialty: General Surgery Why: 2:30 pm, Arrive 30 minutes prior to your appointment time, Please bring your insurance card and photo ID Contact information: [address] [place] [address] [facility] [place] [address] [phone]                  Signed: Barnetta Chapel, [facility] 09/28/2022, 3:11 PM Please see Amion for pager number during day hours 7:00am-4:30pm, 7-11:30am on Weekends

## 2022-09-28 NOTE — Op Note (Signed)
Procedure Note  Pre-operative Diagnosis:  cholecystitis, cholelithiasis  Post-operative Diagnosis:  same  Surgeon:  Darnell Level, MD  Assistant:  none   Procedure:  Laparoscopic cholecystectomy with intra-operative cholangiography  Anesthesia:  General  Estimated Blood Loss:  20 cc  Drains: none         Specimen: gallbladder to pathology  Indications:  Patient is a 27 yo female who presents to the ER with signs and symptoms of acute cholecystitis with cholelithiasis.  Now for cholecystectomy.  Procedure description: The patient was seen in the pre-op holding area. The risks, benefits, complications, treatment options, and expected outcomes were previously discussed with the patient. The patient agreed with the proposed plan and has signed the informed consent form.  The patient was transported to operating room #4 at the  [facility]. The patient was placed in the supine position on the operating room table. Following induction of general anesthesia, the abdomen was prepped and draped in the usual aseptic fashion.  An incision was made in the skin near the umbilicus. The midline fascia was incised and the peritoneal cavity was entered and a Hasson cannula was introduced under direct vision. The cannula was secured with a 0-Vicryl pursestring suture. Pneumoperitoneum was established with carbon dioxide. Additional cannulae were introduced under direct vision along the right costal margin in the midline, mid-clavicular line, and anterior axillary line.   The gallbladder was identified and the fundus grasped and retracted cephalad. Adhesions were taken down bluntly and the electrocautery was utilized as needed, taking care not to involve any adjacent structures. The infundibulum was grasped and retracted laterally, exposing the peritoneum overlying the triangle of Calot. The peritoneum was incised and structures exposed with blunt dissection. The cystic duct was clearly identified,  bluntly dissected circumferentially, and clipped at the neck of the gallbladder.  An incision was made in the cystic duct and the cholangiogram catheter introduced. The catheter was secured using an ligaclip.  Real-time cholangiography was performed using C-arm fluoroscopy.  There was rapid filling of a normal caliber common bile duct.  There was reflux of contrast into the left and right hepatic ductal systems.  There was free flow distally into the duodenum without filling defect or obstruction.  The catheter was removed from the peritoneal cavity.  The cystic duct was then ligated with ligaclips and divided. The cystic artery was identified, dissected circumferentially, ligated with ligaclips, and divided.  The gallbladder was dissected away from the gallbladder bed using the electrocautery for hemostasis. The gallbladder was completely removed from the liver and placed into an endocatch bag. The gallbladder was removed in the endocatch bag through the umbilical port site and submitted to pathology for review.  The right upper quadrant was irrigated and the gallbladder bed was inspected. Hemostasis was achieved with the electrocautery.  Cannulae were removed under direct vision and good hemostasis was noted. Pneumoperitoneum was released and the majority of the carbon dioxide evacuated. The umbilical wound was irrigated and the fascia was then closed with the pursestring suture.  Local anesthetic was infiltrated at all port sites. Skin incisions were closed with 4-0 Monocril subcuticular sutures and Dermabond was applied.  Instrument, sponge, and needle counts were correct at the conclusion of the case.  The patient was awakened from anesthesia and brought to the recovery room in stable condition.  The patient tolerated the procedure well.   Darnell Level, MD [facility] Surgery Office: [phone]

## 2022-09-29 ENCOUNTER — Encounter (HOSPITAL_COMMUNITY): Payer: Self-pay | Admitting: Surgery

## 2022-09-29 LAB — SURGICAL PATHOLOGY

## 2023-02-28 ENCOUNTER — Telehealth: Payer: Medicaid Other | Admitting: Emergency Medicine

## 2023-02-28 DIAGNOSIS — R197 Diarrhea, unspecified: Secondary | ICD-10-CM

## 2023-02-28 NOTE — Patient Instructions (Signed)
Kristen Henderson, thank you for joining Kristen Parsons, NP for today's virtual visit.  While this provider is not your primary care provider (PCP), if your PCP is located in our provider database this encounter information will be shared with them immediately following your visit.   A [facility] MyChart account gives you access to today's visit and all your visits, tests, and labs performed at [facility] " click here if you don't have a [facility] MyChart account or go to [email or web address]  Consent: (Patient) Kristen Henderson provided verbal consent for this virtual visit at the beginning of the encounter.  Current Medications:  Current Outpatient Medications:    acetaminophen (TYLENOL) 500 MG tablet, Take 2 tablets (1,000 mg total) by mouth every 6 (six) hours as needed., Disp: 30 tablet, Rfl: 0   AZO-TABS 95 MG tablet, Take 95 mg by mouth 3 (three) times daily as needed for pain., Disp: , Rfl:    bismuth subsalicylate (PEPTO BISMOL) 262 MG/15ML suspension, Take 30 mLs by mouth every 6 (six) hours as needed for indigestion or diarrhea or loose stools., Disp: , Rfl:    calcium carbonate (TUMS EX) 750 MG chewable tablet, Chew 1 tablet by mouth 3 (three) times daily as needed for heartburn., Disp: , Rfl:    fluticasone (FLONASE) 50 MCG/ACT nasal spray, Place 2 sprays into both nostrils daily. (Patient taking differently: Place 2 sprays into both nostrils daily as needed for allergies or rhinitis.), Disp: 16 g, Rfl: 11   Ibuprofen 200 MG CAPS, Take 400 mg by mouth every 6 (six) hours as needed (for headaches or pain)., Disp: , Rfl:    OnabotulinumtoxinA (BOTOX IJ), Inject as directed See admin instructions. Botox injections- As directed every 3-4 months for migraines, Disp: , Rfl:    oxyCODONE (OXY IR/ROXICODONE) 5 MG immediate release tablet, Take 1 tablet (5 mg total) by mouth every 4 (four) hours as needed for moderate pain., Disp: 15 tablet, Rfl: 0   ZYRTEC  ALLERGY 10 MG tablet, Take 10 mg by mouth daily as needed for allergies or rhinitis., Disp: , Rfl:    Medications ordered in this encounter:  No orders of the defined types were placed in this encounter.    *If you need refills on other medications prior to your next appointment, please contact your pharmacy*  Follow-Up: Call back or seek an in-person evaluation if the symptoms worsen or if the condition fails to improve as anticipated.  [facility] [phone]  Other Instructions  If you do not have a primary care provider, and you want to find one at [facility], go to [email or web address], click on "primary care," click on "new patients: schedule online," click on Family Practice or Internal Medicine, and choose an appointment place and time that fits your schedule.   You can all [facility] Application Assistance [phone] - you already have but I am hoping they can help figure out how to access it.   The diarrhea is not uncommon after having your gall bladder out and there are some medicines to help relieve the diarrhea.     If you have been instructed to have an in-person evaluation today at a local Urgent Care facility, please use the link below. It will take you to a list of all of our available [facility], including address, phone number and hours of operation. Please do not delay care.   Urgent Cares  If you or a family member  do not have a primary care provider, use the link below to schedule a visit and establish care. When you choose a [facility] primary care physician or advanced practice provider, you gain a long-term partner in health. Find a Primary Care Provider  Learn more about [facility]'s in-office and virtual care options: [facility] - Get Care Now

## 2023-02-28 NOTE — Progress Notes (Signed)
Virtual Visit Consent   Kristen Henderson, you are scheduled for a virtual visit with a [facility] provider today. Just as with appointments in the office, your consent must be obtained to participate. Your consent will be active for this visit and any virtual visit you may have with one of our providers in the next 365 days. If you have a MyChart account, a copy of this consent can be sent to you electronically.  As this is a virtual visit, video technology does not allow for your provider to perform a traditional examination. This may limit your provider's ability to fully assess your condition. If your provider identifies any concerns that need to be evaluated in person or the need to arrange testing (such as labs, EKG, etc.), we will make arrangements to do so. Although advances in technology are sophisticated, we cannot ensure that it will always work on either your end or our end. If the connection with a video visit is poor, the visit may have to be switched to a telephone visit. With either a video or telephone visit, we are not always able to ensure that we have a secure connection.  By engaging in this virtual visit, you consent to the provision of healthcare and authorize for your insurance to be billed (if applicable) for the services provided during this visit. Depending on your insurance coverage, you may receive a charge related to this service.  I need to obtain your verbal consent now. Are you willing to proceed with your visit today? Kristen Henderson has provided verbal consent on 02/28/2023 for a virtual visit (video or telephone). Cathlyn Parsons, NP  Date: 02/28/2023 4:12 PM  Virtual Visit via Video Note   I, Cathlyn Parsons, connected with  Kristen Henderson  ([id number], 03/09/1996) on 02/28/23 at  4:00 PM EST by a video-enabled telemedicine application and verified that I am speaking with the correct person using two identifiers.  Location: Patient:  Virtual Visit Location Patient: Home Provider: Virtual Visit Location Provider: Home Office   I discussed the limitations of evaluation and management by telemedicine and the availability of in person appointments. The patient expressed understanding and agreed to proceed.    History of Present Illness: Kristen Henderson is a 27 y.o. who identifies as a female who was assigned female at birth, and is being seen today for diarrhea for 5 months after cholecystectomy. Loose stool about 2-3 times per day since gall bladder surgery. Does not have pcp and wants one. Has been reduced fat in her diet. No pain, sometimes has nausea.   HPI: HPI  Problems:  Patient Active Problem List   Diagnosis Date Noted   Acute cholecystitis 09/27/2022    Allergies:  Allergies  Allergen Reactions   Macrobid [Nitrofurantoin] Nausea Only   Medications:  Current Outpatient Medications:    acetaminophen (TYLENOL) 500 MG tablet, Take 2 tablets (1,000 mg total) by mouth every 6 (six) hours as needed., Disp: 30 tablet, Rfl: 0   AZO-TABS 95 MG tablet, Take 95 mg by mouth 3 (three) times daily as needed for pain., Disp: , Rfl:    bismuth subsalicylate (PEPTO BISMOL) 262 MG/15ML suspension, Take 30 mLs by mouth every 6 (six) hours as needed for indigestion or diarrhea or loose stools., Disp: , Rfl:    calcium carbonate (TUMS EX) 750 MG chewable tablet, Chew 1 tablet by mouth 3 (three) times daily as needed for heartburn., Disp: , Rfl:    fluticasone (FLONASE) 50  MCG/ACT nasal spray, Place 2 sprays into both nostrils daily. (Patient taking differently: Place 2 sprays into both nostrils daily as needed for allergies or rhinitis.), Disp: 16 g, Rfl: 11   Ibuprofen 200 MG CAPS, Take 400 mg by mouth every 6 (six) hours as needed (for headaches or pain)., Disp: , Rfl:    OnabotulinumtoxinA (BOTOX IJ), Inject as directed See admin instructions. Botox injections- As directed every 3-4 months for migraines, Disp: , Rfl:     oxyCODONE (OXY IR/ROXICODONE) 5 MG immediate release tablet, Take 1 tablet (5 mg total) by mouth every 4 (four) hours as needed for moderate pain., Disp: 15 tablet, Rfl: 0   ZYRTEC ALLERGY 10 MG tablet, Take 10 mg by mouth daily as needed for allergies or rhinitis., Disp: , Rfl:   Observations/Objective: Patient is well-developed, well-nourished in no acute distress.  Resting comfortably  at home.  Head is normocephalic, atraumatic.  No labored breathing.  Speech is clear and coherent with logical content.  Patient is alert and oriented at baseline.    Assessment and Plan: 1. 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 Discussed there are treatments - bile acid sequestrants - but she needs pcp to rx.   Follow Up Instructions: I discussed the assessment and treatment plan with the patient. The patient was provided an opportunity to ask questions and all were answered. The patient agreed with the plan and demonstrated an understanding of the instructions.  A copy of instructions were sent to the patient via MyChart unless otherwise noted below.   The patient was advised to call back or seek an in-person evaluation if the symptoms worsen or if the condition fails to improve as anticipated.    Cathlyn Parsons, NP

## 2023-05-30 ENCOUNTER — Ambulatory Visit (INDEPENDENT_AMBULATORY_CARE_PROVIDER_SITE_OTHER): Payer: Medicaid Other | Admitting: Family

## 2023-05-30 VITALS — BP 108/71 | HR 77 | Temp 98.0°F | Ht 63.0 in | Wt 174.8 lb

## 2023-05-30 DIAGNOSIS — R233 Spontaneous ecchymoses: Secondary | ICD-10-CM

## 2023-05-30 DIAGNOSIS — Z23 Encounter for immunization: Secondary | ICD-10-CM

## 2023-05-30 DIAGNOSIS — M79605 Pain in left leg: Secondary | ICD-10-CM

## 2023-05-30 DIAGNOSIS — M79604 Pain in right leg: Secondary | ICD-10-CM

## 2023-05-30 DIAGNOSIS — Z9049 Acquired absence of other specified parts of digestive tract: Secondary | ICD-10-CM

## 2023-05-30 DIAGNOSIS — L309 Dermatitis, unspecified: Secondary | ICD-10-CM

## 2023-05-30 DIAGNOSIS — Z7689 Persons encountering health services in other specified circumstances: Secondary | ICD-10-CM

## 2023-05-30 DIAGNOSIS — Z130 Encounter for screening for diseases of the blood and blood-forming organs and certain disorders involving the immune mechanism: Secondary | ICD-10-CM

## 2023-05-30 MED ORDER — TRIAMCINOLONE ACETONIDE 0.025 % EX CREA
1.0000 | TOPICAL_CREAM | Freq: Two times a day (BID) | CUTANEOUS | 1 refills | Status: DC
Start: 2023-05-30 — End: 2023-07-04

## 2023-05-30 NOTE — Progress Notes (Signed)
 Patient states excema and that it is usually bad on her face, but now its getting worst on her body.   States her legs bruise easily, states even when they dont have excema they have a tingling sensation on them.  Wants Flu vaccine.

## 2023-05-30 NOTE — Progress Notes (Signed)
 Subjective:    Kristen Henderson - 28 y.o. female MRN [id number]  Date of birth: 1996-02-16  HPI  Kristen Henderson is to establish care.    Current issues and/or concerns: - Eczema. Denies red flag symptoms. 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 Initially eczema was present only of bilateral hands. States recently eczema has began to move up bilateral arms. Reports taking bleach baths and trying over-the-counter creams with minimal improvement. She was seen by Dermatology in the past but was unable to follow-up due to no health insurance during that time.  - States easily bruising of bilateral lower extremities with pain. Denies red flag symptoms.  - History of gallbladder removal last year. Reports was established with Gastroenterology. - Low iron screening. - No further issues/concerns for discussion today.    ROS per HPI     Health Maintenance:  Health Maintenance Due  Topic Date Due   HPV VACCINES (1 - 3-dose series) Never done   Hepatitis C Screening  Never done   Cervical Cancer Screening (Pap smear)  Never done   COVID-19 Vaccine (1 - 2024-25 season) Never done     Past Medical History: Patient Active Problem List   Diagnosis Date Noted   Acute cholecystitis 09/27/2022      Social History   reports that she has never smoked. She has never used smokeless tobacco. She reports current alcohol use. She reports that she does not use drugs.   Family History  family history includes Allergic rhinitis in her brother; Asthma in her brother; Diabetes in her paternal grandmother; Eczema in her brother.   Medications: reviewed and updated   Objective:   Physical Exam BP 108/71   Pulse 77   Temp 98 F (36.7 C) (Oral)   Ht 5\' 3"  (1.6 m)   Wt 174 lb 12.8 oz (79.3 kg)   LMP 05/11/2023 (Exact Date)   SpO2 97%   BMI 30.96 kg/m   Physical Exam HENT:     Head: Normocephalic and atraumatic.     Nose: Nose normal.      Mouth/Throat:     Mouth: Mucous membranes are moist.     Pharynx: Oropharynx is clear.  Eyes:     Extraocular Movements: Extraocular movements intact.     Conjunctiva/sclera: Conjunctivae normal.     Pupils: Pupils are equal, round, and reactive to light.  Cardiovascular:     Rate and Rhythm: Normal rate and regular rhythm.     Pulses: Normal pulses.     Heart sounds: Normal heart sounds.  Pulmonary:     Effort: Pulmonary effort is normal.     Breath sounds: Normal breath sounds.  Musculoskeletal:        General: Normal range of motion.     Right shoulder: Normal.     Left shoulder: Normal.     Right upper arm: Normal.     Left upper arm: Normal.     Right elbow: Normal.     Left elbow: Normal.     Right forearm: Normal.     Left forearm: Normal.     Right wrist: Normal.     Left wrist: Normal.     Right hand: Normal.     Left hand: Normal.     Cervical back: Normal, normal range of motion and neck supple.     Thoracic back: Normal.     Lumbar back: Normal.     Right hip: Normal.  Left hip: Normal.     Right upper leg: Normal.     Left upper leg: Normal.     Right knee: Normal.     Left knee: Normal.     Right lower leg: Normal.     Left lower leg: Normal.     Right ankle: Normal.     Left ankle: Normal.     Right foot: Normal.     Left foot: Normal.  Skin:    Comments: Eczema.   Neurological:     General: No focal deficit present.     Mental Status: She is alert and oriented to person, place, and time.  Psychiatric:        Mood and Affect: Mood normal.        Behavior: Behavior normal.        Assessment & Plan:  1. Encounter to establish care (Primary) - Patient presents today to establish care. During the interim follow-up with primary provider as scheduled.  - Return for annual physical examination, labs, and health maintenance. Arrive fasting meaning having no food for at least 8 hours prior to appointment. You may have only water or black coffee.  Please take scheduled medications as normal.  2. Eczema, unspecified type - Triamcinolone  cream as prescribed. Counseled on medication adherence/adverse effects.  - Referral to Dermatology for evaluation/management.  - Follow-up with primary provider as scheduled.  - triamcinolone  (KENALOG ) 0.025 % cream; Apply 1 Application topically 2 (two) times daily.  Dispense: 60 g; Refill: 1 - Ambulatory referral to Dermatology  3. Pain in both lower extremities - Routine screening.  - VAS US  LOWER EXTREMITY VENOUS (DVT); Future  4. Easy bruising - Referral to Dermatology for evaluation/management. - Ambulatory referral to Dermatology  5. History of cholecystectomy - Referral to Gastroenterology for evaluation/management. - Ambulatory referral to Gastroenterology  6. Screening for deficiency anemia - Routine screening.  - CBC - Iron, TIBC and Ferritin Panel   7. Encounter for immunization - Administered. - Flu vaccine trivalent PF, 6mos and older(Flulaval,Afluria,Fluarix,Fluzone)     Patient was given clear instructions to go to Emergency Department or return to medical center if symptoms don't improve, worsen, or new problems develop.The patient verbalized understanding.  I discussed the assessment and treatment plan with the patient. The patient was provided an opportunity to ask questions and all were answered. The patient agreed with the plan and demonstrated an understanding of the instructions.   The patient was advised to call back or seek an in-person evaluation if the symptoms worsen or if the condition fails to improve as anticipated.    Lavona Pounds, NP 05/30/2023, 2:27 PM Primary Care at [facility]

## 2023-05-31 ENCOUNTER — Encounter: Payer: Self-pay | Admitting: Family

## 2023-05-31 ENCOUNTER — Other Ambulatory Visit: Payer: Self-pay | Admitting: Family

## 2023-05-31 DIAGNOSIS — Z130 Encounter for screening for diseases of the blood and blood-forming organs and certain disorders involving the immune mechanism: Secondary | ICD-10-CM

## 2023-05-31 LAB — CBC
Hematocrit: 42.1 % (ref 34.0–46.6)
Hemoglobin: 13.4 g/dL (ref 11.1–15.9)
MCH: 28.3 pg (ref 26.6–33.0)
MCHC: 31.8 g/dL (ref 31.5–35.7)
MCV: 89 fL (ref 79–97)
Platelets: 316 10*3/uL (ref 150–450)
RBC: 4.74 x10E6/uL (ref 3.77–5.28)
RDW: 12.2 % (ref 11.7–15.4)
WBC: 7.3 10*3/uL (ref 3.4–10.8)

## 2023-05-31 LAB — IRON,TIBC AND FERRITIN PANEL

## 2023-06-03 ENCOUNTER — Ambulatory Visit (HOSPITAL_COMMUNITY)
Admission: RE | Admit: 2023-06-03 | Discharge: 2023-06-03 | Disposition: A | Discharge status: Home / Self Care | Payer: Medicaid Other | Source: Ambulatory Visit | Attending: Family | Admitting: Family

## 2023-06-03 ENCOUNTER — Ambulatory Visit: Payer: Self-pay | Admitting: Family

## 2023-06-03 DIAGNOSIS — M79605 Pain in left leg: Secondary | ICD-10-CM | POA: Diagnosis present

## 2023-06-03 DIAGNOSIS — M79604 Pain in right leg: Secondary | ICD-10-CM

## 2023-06-03 NOTE — Telephone Encounter (Signed)
Copied from CRM [id number]. Topic: Clinical - Red Word Triage >> Jun 03, 2023  3:24 PM Phill Myron wrote: Tammy Sours with Vascular US is calling regarding results Reason for Disposition  Health Information question, no triage required and triager able to answer question  Answer Assessment - Initial Assessment Questions 1. REASON FOR CALL or QUESTION: "What is your reason for calling today?" or "How can I best help you?" or "What question do you have that I can help answer?"     Tammy Sours from [facility] - Vascular U/S Department - read results for patient and stated:  Negative DVT bilaterally  Protocols used: Information Only Call - No Triage-A-AH

## 2023-06-03 NOTE — Progress Notes (Signed)
Bilateral lower extremity venous duplex has been completed. Preliminary results can be found in CV Proc through chart review.  Results were given to Angie at Ricky Stabs' NP office.  06/03/23 3:16 PM Olen Cordial RVT

## 2023-06-07 ENCOUNTER — Encounter: Payer: Self-pay | Admitting: Family

## 2023-07-04 ENCOUNTER — Encounter: Payer: Self-pay | Admitting: Family

## 2023-07-04 ENCOUNTER — Ambulatory Visit (INDEPENDENT_AMBULATORY_CARE_PROVIDER_SITE_OTHER): Payer: Medicaid Other | Admitting: Family

## 2023-07-04 VITALS — BP 105/73 | HR 69 | Temp 98.2°F | Resp 16 | Ht 63.0 in | Wt 175.8 lb

## 2023-07-04 DIAGNOSIS — Z7689 Persons encountering health services in other specified circumstances: Secondary | ICD-10-CM

## 2023-07-04 DIAGNOSIS — Z23 Encounter for immunization: Secondary | ICD-10-CM

## 2023-07-04 DIAGNOSIS — Z130 Encounter for screening for diseases of the blood and blood-forming organs and certain disorders involving the immune mechanism: Secondary | ICD-10-CM

## 2023-07-04 DIAGNOSIS — Z1321 Encounter for screening for nutritional disorder: Secondary | ICD-10-CM

## 2023-07-04 DIAGNOSIS — Z131 Encounter for screening for diabetes mellitus: Secondary | ICD-10-CM

## 2023-07-04 DIAGNOSIS — Z1329 Encounter for screening for other suspected endocrine disorder: Secondary | ICD-10-CM

## 2023-07-04 DIAGNOSIS — Z124 Encounter for screening for malignant neoplasm of cervix: Secondary | ICD-10-CM

## 2023-07-04 DIAGNOSIS — Z0000 Encounter for general adult medical examination without abnormal findings: Secondary | ICD-10-CM | POA: Diagnosis not present

## 2023-07-04 DIAGNOSIS — L309 Dermatitis, unspecified: Secondary | ICD-10-CM

## 2023-07-04 DIAGNOSIS — Z13228 Encounter for screening for other metabolic disorders: Secondary | ICD-10-CM

## 2023-07-04 DIAGNOSIS — Z13220 Encounter for screening for lipoid disorders: Secondary | ICD-10-CM

## 2023-07-04 DIAGNOSIS — Z6831 Body mass index (BMI) 31.0-31.9, adult: Secondary | ICD-10-CM

## 2023-07-04 DIAGNOSIS — R635 Abnormal weight gain: Secondary | ICD-10-CM | POA: Diagnosis not present

## 2023-07-04 MED ORDER — TRIAMCINOLONE ACETONIDE 0.025 % EX CREA
1.0000 | TOPICAL_CREAM | Freq: Two times a day (BID) | CUTANEOUS | 1 refills | Status: AC
Start: 2023-07-04 — End: ?

## 2023-07-04 MED ORDER — PHENTERMINE HCL 15 MG PO CAPS
15.0000 mg | ORAL_CAPSULE | ORAL | 0 refills | Status: DC
Start: 2023-07-04 — End: 2023-08-01

## 2023-07-04 NOTE — Progress Notes (Signed)
 Patient ID: Kristen Henderson, female    DOB: [birth year]  MRN: [id number]  CC: Annual Exam   Subjective: Kristen Henderson is a 28 y.o. female who presents for annual exam.   Her concerns today include:  - States established with Dermatology. States dermatologist prescribed her an antihistamine which was not covered by her health insurance. Reports she notified dermatologist of this and was told to schedule a follow-up appointment. Requests previously prescribed Triamcinolone cream prescription to be refilled. - Requests vitamin screening. - Reports she is watching what she eats and exercising. States she is still gaining weight. States she considered if she has PCOS. She would like to try a weight loss medication.   Patient Active Problem List   Diagnosis Date Noted   Acute cholecystitis 09/27/2022     Current Outpatient Medications on File Prior to Visit  Medication Sig Dispense Refill   ZYRTEC ALLERGY 10 MG tablet Take 10 mg by mouth daily as needed for allergies or rhinitis.     acetaminophen (TYLENOL) 500 MG tablet Take 2 tablets (1,000 mg total) by mouth every 6 (six) hours as needed. 30 tablet 0   AZO-TABS 95 MG tablet Take 95 mg by mouth 3 (three) times daily as needed for pain.     bismuth subsalicylate (PEPTO BISMOL) 262 MG/15ML suspension Take 30 mLs by mouth every 6 (six) hours as needed for indigestion or diarrhea or loose stools.     calcium carbonate (TUMS EX) 750 MG chewable tablet Chew 1 tablet by mouth 3 (three) times daily as needed for heartburn.     fluticasone (FLONASE) 50 MCG/ACT nasal spray Place 2 sprays into both nostrils daily. (Patient taking differently: Place 2 sprays into both nostrils daily as needed for allergies or rhinitis.) 16 g 11   Ibuprofen 200 MG CAPS Take 400 mg by mouth every 6 (six) hours as needed (for headaches or pain).     OnabotulinumtoxinA (BOTOX IJ) Inject as directed See admin instructions. Botox injections- As directed  every 3-4 months for migraines     oxyCODONE (OXY IR/ROXICODONE) 5 MG immediate release tablet Take 1 tablet (5 mg total) by mouth every 4 (four) hours as needed for moderate pain. 15 tablet 0   No current facility-administered medications on file prior to visit.    Allergies  Allergen Reactions   Macrobid [Nitrofurantoin] Nausea Only    Social History   Socioeconomic History   Marital status: Single    Spouse name: Not on file   Number of children: Not on file   Years of education: Not on file   Highest education level: Not on file  Occupational History   Not on file  Tobacco Use   Smoking status: Never   Smokeless tobacco: Never  Vaping Use   Vaping status: Never Used  Substance and Sexual Activity   Alcohol use: Yes    Comment: Occa   Drug use: Never   Sexual activity: Yes    Birth control/protection: Condom  Other Topics Concern   Not on file  Social History Narrative   Not on file   Social Drivers of Health   Financial Resource Strain: Low Risk  (05/30/2023)   Overall Financial Resource Strain (CARDIA)    Difficulty of Paying Living Expenses: Not hard at all  Food Insecurity: No Food Insecurity (09/27/2022)   Hunger Vital Sign    Worried About Running Out of Food in the Last Year: Never true    Ran Out of Food  in the Last Year: Never true  Transportation Needs: No Transportation Needs (09/27/2022)   PRAPARE - Administrator, Civil Service (Medical): No    Lack of Transportation (Non-Medical): No  Physical Activity: Sufficiently Active (05/30/2023)   Exercise Vital Sign    Days of Exercise per Week: 3 days    Minutes of Exercise per Session: 60 min  Stress: Stress Concern Present (05/30/2023)   [employer] of Occupational Health - Occupational Stress Questionnaire    Feeling of Stress : To some extent  Social Connections: Moderately Isolated (05/30/2023)   Social Connection and Isolation Panel [NHANES]    Frequency of Communication with  Friends and Family: More than three times a week    Frequency of Social Gatherings with Friends and Family: Once a week    Attends Religious Services: Never    Database administrator or Organizations: No    Attends Banker Meetings: Never    Marital Status: Living with partner  Intimate Partner Violence: Not At Risk (09/27/2022)   Humiliation, Afraid, Rape, and Kick questionnaire    Fear of Current or Ex-Partner: No    Emotionally Abused: No    Physically Abused: No    Sexually Abused: No    Family History  Problem Relation Age of Onset   Allergic rhinitis Brother    Asthma Brother    Eczema Brother    Diabetes Paternal Grandmother     Past Surgical History:  Procedure Laterality Date   CHOLECYSTECTOMY N/A 09/28/2022   Procedure: LAPAROSCOPIC CHOLECYSTECTOMY WITH INTRAOPERATIVE CHOLANGIOGRAM;  Surgeon: Darnell Level, MD;  Location: [facility];  Service: General;  Laterality: N/A;    ROS: Review of Systems Negative except as stated above  PHYSICAL EXAM: BP 105/73   Pulse 69   Temp 98.2 F (36.8 C) (Oral)   Resp 16   Ht 5\' 3"  (1.6 m)   Wt 175 lb 12.8 oz (79.7 kg)   SpO2 98%   BMI 31.14 kg/m   Physical Exam HENT:     Head: Normocephalic and atraumatic.     Right Ear: Tympanic membrane, ear canal and external ear normal.     Left Ear: Tympanic membrane, ear canal and external ear normal.     Nose: Nose normal.     Mouth/Throat:     Mouth: Mucous membranes are moist.     Pharynx: Oropharynx is clear.  Eyes:     Extraocular Movements: Extraocular movements intact.     Conjunctiva/sclera: Conjunctivae normal.     Pupils: Pupils are equal, round, and reactive to light.  Neck:     Thyroid: No thyroid mass, thyromegaly or thyroid tenderness.  Cardiovascular:     Rate and Rhythm: Normal rate and regular rhythm.     Pulses: Normal pulses.     Heart sounds: Normal heart sounds.  Pulmonary:     Effort: Pulmonary effort is normal.     Breath sounds: Normal  breath sounds.  Chest:     Comments: Patient declined. Abdominal:     General: Bowel sounds are normal.     Palpations: Abdomen is soft.  Genitourinary:    Comments: Patient declined. Musculoskeletal:        General: Normal range of motion.     Right shoulder: Normal.     Left shoulder: Normal.     Right upper arm: Normal.     Left upper arm: Normal.     Right elbow: Normal.     Left elbow:  Normal.     Right forearm: Normal.     Left forearm: Normal.     Right wrist: Normal.     Left wrist: Normal.     Right hand: Normal.     Left hand: Normal.     Cervical back: Normal, normal range of motion and neck supple.     Thoracic back: Normal.     Lumbar back: Normal.     Right hip: Normal.     Left hip: Normal.     Right upper leg: Normal.     Left upper leg: Normal.     Right knee: Normal.     Left knee: Normal.     Right lower leg: Normal.     Left lower leg: Normal.     Right ankle: Normal.     Left ankle: Normal.     Right foot: Normal.     Left foot: Normal.  Skin:    General: Skin is warm and dry.     Capillary Refill: Capillary refill takes less than 2 seconds.  Neurological:     General: No focal deficit present.     Mental Status: She is alert and oriented to person, place, and time.  Psychiatric:        Mood and Affect: Mood normal.        Behavior: Behavior normal.    ASSESSMENT AND PLAN: 1. Annual physical exam (Primary) - Counseled on 150 minutes of exercise per week as tolerated, healthy eating (including decreased daily intake of saturated fats, cholesterol, added sugars, sodium), STI prevention, and routine healthcare maintenance.  2. Screening for metabolic disorder - Routine screening.  - CMP14+EGFR  3. Screening for deficiency anemia - Routine screening.  - CBC  4. Diabetes mellitus screening - Routine screening.  - Hemoglobin A1c  5. Screening cholesterol level - Routine screening.  - Lipid panel  6. Thyroid disorder screen - Routine  screening.  - TSH  7. Encounter for vitamin deficiency screening - Routine screening.  - Vitamin D, 25-hydroxy  8. Pap smear for cervical cancer screening - Patient requests screening for PCOS. - Referral to Gynecology for evaluation/management.  - Ambulatory referral to Gynecology  9. Encounter for weight management 10. BMI 31.0-31.9,adult 11. Weight gain - Phentermine as prescribed. Counseled on medication adherence and adverse effects.  - I did check the [facility] prescription drug database. - Counseled on low-sodium DASH diet and 150 minutes of moderate intensity exercise per week as tolerated to assist with weight management.  - Patient declined referral to Medical Weight Management. - Follow-up with primary provider in 4 weeks or sooner if needed. - phentermine 15 MG capsule; Take 1 capsule (15 mg total) by mouth every morning.  Dispense: 30 capsule; Refill: 0  12. Eczema, unspecified type - Triamcinolone cream as prescribed. Counseled on medication adherence/adverse effects. - Keep all scheduled appointments with Dermatology. - triamcinolone (KENALOG) 0.025 % cream; Apply 1 Application topically 2 (two) times daily.  Dispense: 60 g; Refill: 1  13. Immunization due - Administered. - HPV 9-valent vaccine,Recombinat    Patient was given the opportunity to ask questions.  Patient verbalized understanding of the plan and was able to repeat key elements of the plan. Patient was given clear instructions to go to Emergency Department or return to medical center if symptoms don't improve, worsen, or new problems develop.The patient verbalized understanding.   Orders Placed This Encounter  Procedures   HPV 9-valent vaccine,Recombinat   CBC   Lipid  panel   CMP14+EGFR   Hemoglobin A1c   TSH   Vitamin D, 25-hydroxy   Ambulatory referral to Gynecology     Requested Prescriptions   Signed Prescriptions Disp Refills   phentermine 15 MG capsule 30 capsule 0    Sig:  Take 1 capsule (15 mg total) by mouth every morning.   triamcinolone (KENALOG) 0.025 % cream 60 g 1    Sig: Apply 1 Application topically 2 (two) times daily.    Return in about 1 year (around 07/03/2024) for Physical per patient preference and 4 weeks weight check.  Rema Fendt, NP

## 2023-07-05 ENCOUNTER — Other Ambulatory Visit: Payer: Self-pay | Admitting: Family

## 2023-07-05 ENCOUNTER — Encounter: Payer: Self-pay | Admitting: Family

## 2023-07-05 DIAGNOSIS — E559 Vitamin D deficiency, unspecified: Secondary | ICD-10-CM

## 2023-07-05 LAB — CMP14+EGFR
ALT: 10 IU/L (ref 0–32)
AST: 16 IU/L (ref 0–40)
Albumin: 4 g/dL (ref 4.0–5.0)
Alkaline Phosphatase: 64 IU/L (ref 44–121)
BUN/Creatinine Ratio: 21 (ref 9–23)
BUN: 14 mg/dL (ref 6–20)
Bilirubin Total: 0.4 mg/dL (ref 0.0–1.2)
CO2: 23 mmol/L (ref 20–29)
Calcium: 9.1 mg/dL (ref 8.7–10.2)
Chloride: 107 mmol/L — ABNORMAL HIGH (ref 96–106)
Creatinine, Ser: 0.67 mg/dL (ref 0.57–1.00)
Globulin, Total: 3.1 g/dL (ref 1.5–4.5)
Glucose: 82 mg/dL (ref 70–99)
Potassium: 4.6 mmol/L (ref 3.5–5.2)
Sodium: 141 mmol/L (ref 134–144)
Total Protein: 7.1 g/dL (ref 6.0–8.5)
eGFR: 124 mL/min/{1.73_m2} (ref >59)

## 2023-07-05 LAB — CBC
Hematocrit: 42.7 % (ref 34.0–46.6)
Hemoglobin: 13.6 g/dL (ref 11.1–15.9)
MCH: 28.2 pg (ref 26.6–33.0)
MCHC: 31.9 g/dL (ref 31.5–35.7)
MCV: 89 fL (ref 79–97)
Platelets: 337 10*3/uL (ref 150–450)
RBC: 4.82 x10E6/uL (ref 3.77–5.28)
RDW: 12.2 % (ref 11.7–15.4)
WBC: 5.4 10*3/uL (ref 3.4–10.8)

## 2023-07-05 LAB — HEMOGLOBIN A1C
Est. average glucose Bld gHb Est-mCnc: 108 mg/dL
Hgb A1c MFr Bld: 5.4 % (ref 4.8–5.6)

## 2023-07-05 LAB — TSH: TSH: 2.27 u[IU]/mL (ref 0.450–4.500)

## 2023-07-05 LAB — VITAMIN D 25 HYDROXY (VIT D DEFICIENCY, FRACTURES): Vit D, 25-Hydroxy: 19.7 ng/mL — ABNORMAL LOW (ref 30.0–100.0)

## 2023-07-05 LAB — LIPID PANEL
Chol/HDL Ratio: 3 ratio (ref 0.0–4.4)
Cholesterol, Total: 149 mg/dL (ref 100–199)
HDL: 50 mg/dL (ref >39)
LDL Chol Calc (NIH): 77 mg/dL (ref 0–99)
Triglycerides: 122 mg/dL (ref 0–149)
VLDL Cholesterol Cal: 22 mg/dL (ref 5–40)

## 2023-07-05 MED ORDER — VITAMIN D (ERGOCALCIFEROL) 1.25 MG (50000 UNIT) PO CAPS
50000.0000 [IU] | ORAL_CAPSULE | ORAL | 0 refills | Status: AC
Start: 2023-07-05 — End: 2023-09-21

## 2023-08-01 ENCOUNTER — Ambulatory Visit: Admitting: Family

## 2023-08-01 ENCOUNTER — Encounter: Payer: Self-pay | Admitting: Family

## 2023-08-01 VITALS — BP 113/82 | HR 83 | Temp 98.8°F | Resp 16 | Ht 63.0 in | Wt 166.2 lb

## 2023-08-01 DIAGNOSIS — Z23 Encounter for immunization: Secondary | ICD-10-CM | POA: Diagnosis not present

## 2023-08-01 DIAGNOSIS — Z7689 Persons encountering health services in other specified circumstances: Secondary | ICD-10-CM

## 2023-08-01 DIAGNOSIS — Z6829 Body mass index (BMI) 29.0-29.9, adult: Secondary | ICD-10-CM

## 2023-08-01 DIAGNOSIS — R635 Abnormal weight gain: Secondary | ICD-10-CM | POA: Diagnosis not present

## 2023-08-01 MED ORDER — PHENTERMINE HCL 30 MG PO CAPS
30.0000 mg | ORAL_CAPSULE | ORAL | 0 refills | Status: DC
Start: 2023-08-01 — End: 2023-08-31

## 2023-08-01 NOTE — Progress Notes (Signed)
 Patient ID: Kristen Henderson, female    DOB: [birth year]  MRN: [id number]  CC: Weight Check  Subjective: Kristen Henderson is a 28 y.o. female who presents for weight check.   Her concerns today include:  - Doing well on Phentermine, no issues/concerns. - Gastroenterology referral related to patient's history of cholecystectomy and frequent bowel movements. Patient reports Gastroenterology called to schedule an appointment and she plans to return their call soon.  - Established with Dermatology.  Patient Active Problem List   Diagnosis Date Noted   Acute cholecystitis 09/27/2022     Current Outpatient Medications on File Prior to Visit  Medication Sig Dispense Refill   triamcinolone (KENALOG) 0.025 % cream Apply 1 Application topically 2 (two) times daily. 60 g 1   ZYRTEC ALLERGY 10 MG tablet Take 10 mg by mouth daily as needed for allergies or rhinitis.     acetaminophen (TYLENOL) 500 MG tablet Take 2 tablets (1,000 mg total) by mouth every 6 (six) hours as needed. 30 tablet 0   AZO-TABS 95 MG tablet Take 95 mg by mouth 3 (three) times daily as needed for pain.     bismuth subsalicylate (PEPTO BISMOL) 262 MG/15ML suspension Take 30 mLs by mouth every 6 (six) hours as needed for indigestion or diarrhea or loose stools.     calcium carbonate (TUMS EX) 750 MG chewable tablet Chew 1 tablet by mouth 3 (three) times daily as needed for heartburn.     fluticasone (FLONASE) 50 MCG/ACT nasal spray Place 2 sprays into both nostrils daily. (Patient taking differently: Place 2 sprays into both nostrils daily as needed for allergies or rhinitis.) 16 g 11   Ibuprofen 200 MG CAPS Take 400 mg by mouth every 6 (six) hours as needed (for headaches or pain).     OnabotulinumtoxinA (BOTOX IJ) Inject as directed See admin instructions. Botox injections- As directed every 3-4 months for migraines     oxyCODONE (OXY IR/ROXICODONE) 5 MG immediate release tablet Take 1 tablet (5 mg total) by  mouth every 4 (four) hours as needed for moderate pain. 15 tablet 0   Vitamin D, Ergocalciferol, (DRISDOL) 1.25 MG (50000 UNIT) CAPS capsule Take 1 capsule (50,000 Units total) by mouth every 7 (seven) days for 12 doses. (Patient not taking: Reported on 08/01/2023) 12 capsule 0   No current facility-administered medications on file prior to visit.    Allergies  Allergen Reactions   Macrobid [Nitrofurantoin] Nausea Only    Social History   Socioeconomic History   Marital status: Single    Spouse name: Not on file   Number of children: Not on file   Years of education: Not on file   Highest education level: Not on file  Occupational History   Not on file  Tobacco Use   Smoking status: Never   Smokeless tobacco: Never  Vaping Use   Vaping status: Never Used  Substance and Sexual Activity   Alcohol use: Yes    Comment: Occa   Drug use: Never   Sexual activity: Yes    Birth control/protection: Condom  Other Topics Concern   Not on file  Social History Narrative   Not on file   Social Drivers of Health   Financial Resource Strain: Low Risk  (05/30/2023)   Overall Financial Resource Strain (CARDIA)    Difficulty of Paying Living Expenses: Not hard at all  Food Insecurity: No Food Insecurity (09/27/2022)   Hunger Vital Sign    Worried About Running Out  of Food in the Last Year: Never true    Ran Out of Food in the Last Year: Never true  Transportation Needs: No Transportation Needs (09/27/2022)   PRAPARE - Administrator, Civil Service (Medical): No    Lack of Transportation (Non-Medical): No  Physical Activity: Sufficiently Active (05/30/2023)   Exercise Vital Sign    Days of Exercise per Week: 3 days    Minutes of Exercise per Session: 60 min  Stress: Stress Concern Present (05/30/2023)   [employer] of Occupational Health - Occupational Stress Questionnaire    Feeling of Stress : To some extent  Social Connections: Moderately Isolated (05/30/2023)    Social Connection and Isolation Panel [NHANES]    Frequency of Communication with Friends and Family: More than three times a week    Frequency of Social Gatherings with Friends and Family: Once a week    Attends Religious Services: Never    Database administrator or Organizations: No    Attends Banker Meetings: Never    Marital Status: Living with partner  Intimate Partner Violence: Not At Risk (09/27/2022)   Humiliation, Afraid, Rape, and Kick questionnaire    Fear of Current or Ex-Partner: No    Emotionally Abused: No    Physically Abused: No    Sexually Abused: No    Family History  Problem Relation Age of Onset   Allergic rhinitis Brother    Asthma Brother    Eczema Brother    Diabetes Paternal Grandmother     Past Surgical History:  Procedure Laterality Date   CHOLECYSTECTOMY N/A 09/28/2022   Procedure: LAPAROSCOPIC CHOLECYSTECTOMY WITH INTRAOPERATIVE CHOLANGIOGRAM;  Surgeon: Darnell Level, MD;  Location: [facility];  Service: General;  Laterality: N/A;    ROS: Review of Systems Negative except as stated above  PHYSICAL EXAM: BP 113/82   Pulse 83   Temp 98.8 F (37.1 C) (Oral)   Resp 16   Ht 5\' 3"  (1.6 m)   Wt 166 lb 3.2 oz (75.4 kg)   LMP 07/30/2023 (Exact Date)   SpO2 97%   BMI 29.44 kg/m   Wt Readings from Last 3 Encounters:  08/01/23 166 lb 3.2 oz (75.4 kg)  07/04/23 175 lb 12.8 oz (79.7 kg)  05/30/23 174 lb 12.8 oz (79.3 kg)   Physical Exam HENT:     Head: Normocephalic and atraumatic.     Nose: Nose normal.     Mouth/Throat:     Mouth: Mucous membranes are moist.     Pharynx: Oropharynx is clear.  Eyes:     Extraocular Movements: Extraocular movements intact.     Conjunctiva/sclera: Conjunctivae normal.     Pupils: Pupils are equal, round, and reactive to light.  Cardiovascular:     Rate and Rhythm: Normal rate and regular rhythm.     Pulses: Normal pulses.     Heart sounds: Normal heart sounds.  Pulmonary:     Effort: Pulmonary  effort is normal.     Breath sounds: Normal breath sounds.  Musculoskeletal:        General: Normal range of motion.     Cervical back: Normal range of motion and neck supple.  Neurological:     General: No focal deficit present.     Mental Status: She is alert and oriented to person, place, and time.  Psychiatric:        Mood and Affect: Mood normal.        Behavior: Behavior normal.  ASSESSMENT AND PLAN: 1. Encounter for weight management (Primary) 2. BMI 29.0-29.9,adult 3. Weight gain - Patient lost 9 pounds since previous office visit.  - Increase Phentermine from 15 mg to 30 mg as prescribed. Counseled on medication adherence/adverse effects. - Follow-up with primary provider in 4 weeks or sooner if needed. - phentermine 30 MG capsule; Take 1 capsule (30 mg total) by mouth every morning.  Dispense: 30 capsule; Refill: 0  4. Immunization due - Administered. - HPV 9-valent vaccine,Recombinat   Patient was given the opportunity to ask questions.  Patient verbalized understanding of the plan and was able to repeat key elements of the plan. Patient was given clear instructions to go to Emergency Department or return to medical center if symptoms don't improve, worsen, or new problems develop.The patient verbalized understanding.   Orders Placed This Encounter  Procedures   HPV 9-valent vaccine,Recombinat     Requested Prescriptions   Signed Prescriptions Disp Refills   phentermine 30 MG capsule 30 capsule 0    Sig: Take 1 capsule (30 mg total) by mouth every morning.    Return in about 4 weeks (around 08/29/2023) for Follow-Up or next available weight check.  Senaida Dama, NP

## 2023-08-01 NOTE — Progress Notes (Signed)
 4 week follow up.  Answer about GI referral does not know why she is going.

## 2023-08-24 ENCOUNTER — Encounter (HOSPITAL_COMMUNITY): Payer: Self-pay

## 2023-08-29 ENCOUNTER — Encounter: Admitting: Obstetrics and Gynecology

## 2023-08-31 ENCOUNTER — Ambulatory Visit: Admitting: Family

## 2023-08-31 ENCOUNTER — Encounter: Payer: Self-pay | Admitting: Family

## 2023-08-31 VITALS — BP 115/77 | HR 79 | Temp 98.1°F | Resp 16 | Ht 63.0 in | Wt 154.0 lb

## 2023-08-31 DIAGNOSIS — E669 Obesity, unspecified: Secondary | ICD-10-CM

## 2023-08-31 DIAGNOSIS — Z6827 Body mass index (BMI) 27.0-27.9, adult: Secondary | ICD-10-CM

## 2023-08-31 DIAGNOSIS — Z7689 Persons encountering health services in other specified circumstances: Secondary | ICD-10-CM

## 2023-08-31 MED ORDER — PHENTERMINE HCL 37.5 MG PO CAPS
37.5000 mg | ORAL_CAPSULE | ORAL | 0 refills | Status: AC
Start: 2023-08-31 — End: ?

## 2023-08-31 NOTE — Progress Notes (Signed)
 Patient ID: Kristen Henderson, female    DOB: 1995-12-23  MRN: [id number]  CC: Weight Check  Subjective: Kristen Henderson is a 28 y.o. female who presents for weight check.    Her concerns today include:  Doing well on Phentermine , no issues/concerns. States she noticed improvement of menstrual cramps since taking Phentermine . States she has a goal to lose at least 10 more pounds. She watches what she eats. She exercises.  Patient Active Problem List   Diagnosis Date Noted   Acute cholecystitis 09/27/2022     Current Outpatient Medications on File Prior to Visit  Medication Sig Dispense Refill   triamcinolone  (KENALOG ) 0.025 % cream Apply 1 Application topically 2 (two) times daily. 60 g 1   ZYRTEC  ALLERGY 10 MG tablet Take 10 mg by mouth daily as needed for allergies or rhinitis.     acetaminophen  (TYLENOL ) 500 MG tablet Take 2 tablets (1,000 mg total) by mouth every 6 (six) hours as needed. 30 tablet 0   AZO-TABS 95 MG tablet Take 95 mg by mouth 3 (three) times daily as needed for pain.     bismuth subsalicylate (PEPTO BISMOL) 262 MG/15ML suspension Take 30 mLs by mouth every 6 (six) hours as needed for indigestion or diarrhea or loose stools.     calcium carbonate (TUMS EX) 750 MG chewable tablet Chew 1 tablet by mouth 3 (three) times daily as needed for heartburn.     fluticasone  (FLONASE ) 50 MCG/ACT nasal spray Place 2 sprays into both nostrils daily. (Patient taking differently: Place 2 sprays into both nostrils daily as needed for allergies or rhinitis.) 16 g 11   Ibuprofen  200 MG CAPS Take 400 mg by mouth every 6 (six) hours as needed (for headaches or pain).     OnabotulinumtoxinA (BOTOX IJ) Inject as directed See admin instructions. Botox injections- As directed every 3-4 months for migraines     oxyCODONE  (OXY IR/ROXICODONE ) 5 MG immediate release tablet Take 1 tablet (5 mg total) by mouth every 4 (four) hours as needed for moderate pain. 15 tablet 0   Vitamin  D, Ergocalciferol , (DRISDOL ) 1.25 MG (50000 UNIT) CAPS capsule Take 1 capsule (50,000 Units total) by mouth every 7 (seven) days for 12 doses. (Patient not taking: Reported on 08/01/2023) 12 capsule 0   No current facility-administered medications on file prior to visit.    Allergies  Allergen Reactions   Macrobid [Nitrofurantoin] Nausea Only    Social History   Socioeconomic History   Marital status: Single    Spouse name: Not on file   Number of children: Not on file   Years of education: Not on file   Highest education level: Not on file  Occupational History   Not on file  Tobacco Use   Smoking status: Never   Smokeless tobacco: Never  Vaping Use   Vaping status: Never Used  Substance and Sexual Activity   Alcohol use: Yes    Comment: Occa   Drug use: Never   Sexual activity: Yes    Birth control/protection: Condom  Other Topics Concern   Not on file  Social History Narrative   Not on file   Social Drivers of Health   Financial Resource Strain: Low Risk  (05/30/2023)   Overall Financial Resource Strain (CARDIA)    Difficulty of Paying Living Expenses: Not hard at all  Food Insecurity: No Food Insecurity (09/27/2022)   Hunger Vital Sign    Worried About Running Out of Food in the Last  Year: Never true    Ran Out of Food in the Last Year: Never true  Transportation Needs: No Transportation Needs (09/27/2022)   PRAPARE - Administrator, Civil Service (Medical): No    Lack of Transportation (Non-Medical): No  Physical Activity: Sufficiently Active (05/30/2023)   Exercise Vital Sign    Days of Exercise per Week: 3 days    Minutes of Exercise per Session: 60 min  Stress: Stress Concern Present (05/30/2023)   [employer] of Occupational Health - Occupational Stress Questionnaire    Feeling of Stress : To some extent  Social Connections: Moderately Isolated (05/30/2023)   Social Connection and Isolation Panel [NHANES]    Frequency of Communication with  Friends and Family: More than three times a week    Frequency of Social Gatherings with Friends and Family: Once a week    Attends Religious Services: Never    Database administrator or Organizations: No    Attends Banker Meetings: Never    Marital Status: Living with partner  Intimate Partner Violence: Not At Risk (09/27/2022)   Humiliation, Afraid, Rape, and Kick questionnaire    Fear of Current or Ex-Partner: No    Emotionally Abused: No    Physically Abused: No    Sexually Abused: No    Family History  Problem Relation Age of Onset   Allergic rhinitis Brother    Asthma Brother    Eczema Brother    Diabetes Paternal Grandmother     Past Surgical History:  Procedure Laterality Date   CHOLECYSTECTOMY N/A 09/28/2022   Procedure: LAPAROSCOPIC CHOLECYSTECTOMY WITH INTRAOPERATIVE CHOLANGIOGRAM;  Surgeon: Oralee Billow, MD;  Location: [facility];  Service: General;  Laterality: N/A;    ROS: Review of Systems Negative except as stated above  PHYSICAL EXAM: BP 115/77   Pulse 79   Temp 98.1 F (36.7 C) (Oral)   Resp 16   Ht 5\' 3"  (1.6 m)   Wt 154 lb (69.9 kg)   LMP 08/25/2023 (Exact Date)   SpO2 92%   BMI 27.28 kg/m   Wt Readings from Last 3 Encounters:  08/31/23 154 lb (69.9 kg)  08/01/23 166 lb 3.2 oz (75.4 kg)  07/04/23 175 lb 12.8 oz (79.7 kg)   Physical Exam HENT:     Head: Normocephalic and atraumatic.     Nose: Nose normal.     Mouth/Throat:     Mouth: Mucous membranes are moist.     Pharynx: Oropharynx is clear.  Eyes:     Extraocular Movements: Extraocular movements intact.     Conjunctiva/sclera: Conjunctivae normal.     Pupils: Pupils are equal, round, and reactive to light.  Cardiovascular:     Rate and Rhythm: Normal rate and regular rhythm.     Pulses: Normal pulses.     Heart sounds: Normal heart sounds.  Pulmonary:     Effort: Pulmonary effort is normal.     Breath sounds: Normal breath sounds.  Musculoskeletal:        General:  Normal range of motion.     Cervical back: Normal range of motion and neck supple.  Neurological:     General: No focal deficit present.     Mental Status: She is alert and oriented to person, place, and time.  Psychiatric:        Mood and Affect: Mood normal.        Behavior: Behavior normal.     ASSESSMENT AND PLAN: 1. Encounter for weight  management (Primary) 2. BMI 27.0-27.9,adult - Patient lost 12 pounds since previous office visit.  - Increase Phentermine  from 30 mg to 37.5 mg as prescribed. Counseled on medication adherence/adverse effects.  - Follow-up with primary provider in 4 weeks or sooner if needed. - phentermine  37.5 MG capsule; Take 1 capsule (37.5 mg total) by mouth every morning.  Dispense: 30 capsule; Refill: 0  Patient was given the opportunity to ask questions.  Patient verbalized understanding of the plan and was able to repeat key elements of the plan. Patient was given clear instructions to go to Emergency Department or return to medical center if symptoms don't improve, worsen, or new problems develop.The patient verbalized understanding.   Requested Prescriptions   Signed Prescriptions Disp Refills   phentermine  37.5 MG capsule 30 capsule 0    Sig: Take 1 capsule (37.5 mg total) by mouth every morning.    Return in about 4 weeks (around 09/28/2023) for Follow-Up or next available weight check.  Senaida Dama, NP

## 2023-08-31 NOTE — Progress Notes (Signed)
 One month follow up, no concerns

## 2023-09-22 ENCOUNTER — Encounter (HOSPITAL_COMMUNITY): Payer: Self-pay

## 2023-09-22 ENCOUNTER — Emergency Department (HOSPITAL_COMMUNITY)

## 2023-09-22 ENCOUNTER — Emergency Department (HOSPITAL_COMMUNITY)
Admission: EM | Admit: 2023-09-22 | Discharge: 2023-09-23 | Disposition: A | Discharge status: Home / Self Care | Attending: Student | Admitting: Student

## 2023-09-22 ENCOUNTER — Other Ambulatory Visit: Payer: Self-pay

## 2023-09-22 DIAGNOSIS — M25531 Pain in right wrist: Secondary | ICD-10-CM | POA: Diagnosis present

## 2023-09-22 DIAGNOSIS — R519 Headache, unspecified: Secondary | ICD-10-CM | POA: Diagnosis not present

## 2023-09-22 DIAGNOSIS — Y92410 Unspecified street and highway as the place of occurrence of the external cause: Secondary | ICD-10-CM | POA: Insufficient documentation

## 2023-09-22 DIAGNOSIS — M25511 Pain in right shoulder: Secondary | ICD-10-CM | POA: Diagnosis not present

## 2023-09-22 MED ORDER — NAPROXEN 500 MG PO TABS
500.0000 mg | ORAL_TABLET | Freq: Once | ORAL | Status: AC
  Administered 2023-09-22: 500 mg via ORAL
  Filled 2023-09-22: qty 1
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
## 2023-09-22 NOTE — Discharge Instructions (Signed)
 Your Ct scan was normal. You are placed in a short course of anti-inflammatories to help with your pain.  Please follow up with the orthopedist listed below regarding your wrist. Please return to the emergency department if you experience any worsening symptoms.
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
## 2023-09-27 ENCOUNTER — Telehealth: Payer: Self-pay | Admitting: Family
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
## 2023-09-28 ENCOUNTER — Other Ambulatory Visit: Payer: Self-pay | Admitting: Family

## 2023-09-28 DIAGNOSIS — M25539 Pain in unspecified wrist: Secondary | ICD-10-CM

## 2023-09-28 NOTE — Telephone Encounter (Signed)
 Complete

## 2023-10-03 ENCOUNTER — Ambulatory Visit: Admitting: Family

## 2023-10-03 ENCOUNTER — Encounter: Payer: Self-pay | Admitting: Family

## 2023-10-03 VITALS — BP 104/72 | HR 101 | Temp 99.1°F | Resp 16 | Ht 63.0 in | Wt 147.0 lb

## 2023-10-03 DIAGNOSIS — M79631 Pain in right forearm: Secondary | ICD-10-CM

## 2023-10-03 DIAGNOSIS — L309 Dermatitis, unspecified: Secondary | ICD-10-CM

## 2023-10-03 DIAGNOSIS — E669 Obesity, unspecified: Secondary | ICD-10-CM

## 2023-10-03 DIAGNOSIS — M25531 Pain in right wrist: Secondary | ICD-10-CM | POA: Diagnosis not present

## 2023-10-03 DIAGNOSIS — Z23 Encounter for immunization: Secondary | ICD-10-CM | POA: Diagnosis not present

## 2023-10-03 DIAGNOSIS — Z7689 Persons encountering health services in other specified circumstances: Secondary | ICD-10-CM

## 2023-10-03 DIAGNOSIS — Z6826 Body mass index (BMI) 26.0-26.9, adult: Secondary | ICD-10-CM

## 2023-10-03 MED ORDER — PHENTERMINE HCL 37.5 MG PO CAPS
37.5000 mg | ORAL_CAPSULE | ORAL | 0 refills | Status: DC
Start: 2023-10-03 — End: 2023-11-07

## 2023-10-03 MED ORDER — TRIAMCINOLONE ACETONIDE 0.025 % EX CREA
1.0000 | TOPICAL_CREAM | Freq: Two times a day (BID) | CUTANEOUS | 2 refills | Status: AC
Start: 2023-10-03 — End: ?
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
## 2023-10-14 ENCOUNTER — Encounter (HOSPITAL_COMMUNITY): Payer: Self-pay | Admitting: Student

## 2023-11-07 ENCOUNTER — Encounter: Payer: Self-pay | Admitting: Family

## 2023-11-07 ENCOUNTER — Ambulatory Visit (INDEPENDENT_AMBULATORY_CARE_PROVIDER_SITE_OTHER): Admitting: Family

## 2023-11-07 VITALS — BP 108/76 | HR 77 | Temp 97.7°F | Resp 16 | Ht 63.0 in | Wt 142.6 lb

## 2023-11-07 DIAGNOSIS — E669 Obesity, unspecified: Secondary | ICD-10-CM

## 2023-11-07 DIAGNOSIS — Z6825 Body mass index (BMI) 25.0-25.9, adult: Secondary | ICD-10-CM

## 2023-11-07 DIAGNOSIS — Z7689 Persons encountering health services in other specified circumstances: Secondary | ICD-10-CM

## 2023-11-07 MED ORDER — PHENTERMINE HCL 30 MG PO CAPS
30.0000 mg | ORAL_CAPSULE | ORAL | 0 refills | Status: DC
Start: 2023-11-07 — End: 2023-12-08

## 2023-11-07 NOTE — Progress Notes (Signed)
 Patient ID: Kristen Henderson, female    DOB: April 30, 1995  MRN: [id number]  CC: Weight Check   Subjective: Kristen Henderson is a 28 y.o. female who presents for weight check.   Her concerns today include:  Doing well on Phentermine , no issues/concerns. States she would like to decrease Phentermine  dose with goal to eventually wean off.   Patient Active Problem List   Diagnosis Date Noted   Acute cholecystitis 09/27/2022     Current Outpatient Medications on File Prior to Visit  Medication Sig Dispense Refill   acetaminophen  (TYLENOL ) 500 MG tablet Take 500 mg by mouth every 6 (six) hours as needed.     triamcinolone  (KENALOG ) 0.025 % cream Apply 1 Application topically 2 (two) times daily. 60 g 2   ZYRTEC  ALLERGY 10 MG tablet Take 10 mg by mouth daily as needed for allergies or rhinitis.     acetaminophen  (TYLENOL ) 500 MG tablet Take 2 tablets (1,000 mg total) by mouth every 6 (six) hours as needed. 30 tablet 0   AZO-TABS 95 MG tablet Take 95 mg by mouth 3 (three) times daily as needed for pain.     bismuth subsalicylate (PEPTO BISMOL) 262 MG/15ML suspension Take 30 mLs by mouth every 6 (six) hours as needed for indigestion or diarrhea or loose stools.     calcium carbonate (TUMS EX) 750 MG chewable tablet Chew 1 tablet by mouth 3 (three) times daily as needed for heartburn.     fluticasone  (FLONASE ) 50 MCG/ACT nasal spray Place 2 sprays into both nostrils daily. (Patient taking differently: Place 2 sprays into both nostrils daily as needed for allergies or rhinitis.) 16 g 11   Ibuprofen  200 MG CAPS Take 400 mg by mouth every 6 (six) hours as needed (for headaches or pain).     OnabotulinumtoxinA (BOTOX IJ) Inject as directed See admin instructions. Botox injections- As directed every 3-4 months for migraines     oxyCODONE  (OXY IR/ROXICODONE ) 5 MG immediate release tablet Take 1 tablet (5 mg total) by mouth every 4 (four) hours as needed for moderate pain. 15 tablet 0    No current facility-administered medications on file prior to visit.    Allergies  Allergen Reactions   Macrobid [Nitrofurantoin] Nausea Only    Social History   Socioeconomic History   Marital status: Single    Spouse name: Not on file   Number of children: Not on file   Years of education: Not on file   Highest education level: Not on file  Occupational History   Not on file  Tobacco Use   Smoking status: Never   Smokeless tobacco: Never  Vaping Use   Vaping status: Never Used  Substance and Sexual Activity   Alcohol use: Yes    Comment: Occa   Drug use: Never   Sexual activity: Yes    Birth control/protection: Condom  Other Topics Concern   Not on file  Social History Narrative   Not on file   Social Drivers of Health   Financial Resource Strain: Low Risk  (05/30/2023)   Overall Financial Resource Strain (CARDIA)    Difficulty of Paying Living Expenses: Not hard at all  Food Insecurity: No Food Insecurity (09/27/2022)   Hunger Vital Sign    Worried About Running Out of Food in the Last Year: Never true    Ran Out of Food in the Last Year: Never true  Transportation Needs: No Transportation Needs (09/27/2022)   PRAPARE - Transportation  Lack of Transportation (Medical): No    Lack of Transportation (Non-Medical): No  Physical Activity: Sufficiently Active (05/30/2023)   Exercise Vital Sign    Days of Exercise per Week: 3 days    Minutes of Exercise per Session: 60 min  Stress: Stress Concern Present (05/30/2023)   [employer] of Occupational Health - Occupational Stress Questionnaire    Feeling of Stress : To some extent  Social Connections: Moderately Isolated (05/30/2023)   Social Connection and Isolation Panel    Frequency of Communication with Friends and Family: More than three times a week    Frequency of Social Gatherings with Friends and Family: Once a week    Attends Religious Services: Never    Database administrator or Organizations: No     Attends Banker Meetings: Never    Marital Status: Living with partner  Intimate Partner Violence: Not At Risk (09/27/2022)   Humiliation, Afraid, Rape, and Kick questionnaire    Fear of Current or Ex-Partner: No    Emotionally Abused: No    Physically Abused: No    Sexually Abused: No    Family History  Problem Relation Age of Onset   Allergic rhinitis Brother    Asthma Brother    Eczema Brother    Diabetes Paternal Grandmother     Past Surgical History:  Procedure Laterality Date   CHOLECYSTECTOMY N/A 09/28/2022   Procedure: LAPAROSCOPIC CHOLECYSTECTOMY WITH INTRAOPERATIVE CHOLANGIOGRAM;  Surgeon: Eletha Boas, MD;  Location: [facility];  Service: General;  Laterality: N/A;    ROS: Review of Systems Negative except as stated above  PHYSICAL EXAM: BP 108/76   Pulse 77   Temp 97.7 F (36.5 C) (Oral)   Resp 16   Ht 5' 3 (1.6 m)   Wt 142 lb 9.6 oz (64.7 kg)   LMP 10/14/2023 (Approximate)   SpO2 99%   BMI 25.26 kg/m   Wt Readings from Last 3 Encounters:  11/07/23 142 lb 9.6 oz (64.7 kg)  10/03/23 147 lb (66.7 kg)  08/31/23 154 lb (69.9 kg)   Physical Exam HENT:     Head: Normocephalic and atraumatic.     Nose: Nose normal.     Mouth/Throat:     Mouth: Mucous membranes are moist.     Pharynx: Oropharynx is clear.  Eyes:     Extraocular Movements: Extraocular movements intact.     Conjunctiva/sclera: Conjunctivae normal.     Pupils: Pupils are equal, round, and reactive to light.  Cardiovascular:     Rate and Rhythm: Normal rate and regular rhythm.     Pulses: Normal pulses.     Heart sounds: Normal heart sounds.  Pulmonary:     Effort: Pulmonary effort is normal.     Breath sounds: Normal breath sounds.  Musculoskeletal:        General: Normal range of motion.     Cervical back: Normal range of motion and neck supple.  Neurological:     General: No focal deficit present.     Mental Status: She is alert and oriented to person, place, and time.   Psychiatric:        Mood and Affect: Mood normal.        Behavior: Behavior normal.     ASSESSMENT AND PLAN: 1. Encounter for weight management (Primary) 2. BMI 25.0-25.9,adult - Patient lost 5 pounds since previous office visit.  - Decrease Phentermine  from 37.5 mg to 30 mg for patient's goal to eventually discontinue.  - Follow-up  with primary provider in 4 weeks or sooner if needed.  - phentermine  30 MG capsule; Take 1 capsule (30 mg total) by mouth every morning.  Dispense: 30 capsule; Refill: 0   Patient was given the opportunity to ask questions.  Patient verbalized understanding of the plan and was able to repeat key elements of the plan. Patient was given clear instructions to go to Emergency Department or return to medical center if symptoms don't improve, worsen, or new problems develop.The patient verbalized understanding.   Requested Prescriptions   Signed Prescriptions Disp Refills   phentermine  30 MG capsule 30 capsule 0    Sig: Take 1 capsule (30 mg total) by mouth every morning.    Return in about 4 weeks (around 12/05/2023) for Follow-Up or next available weight check.  Greig JINNY Drones, NP

## 2023-12-08 ENCOUNTER — Ambulatory Visit: Admitting: Family

## 2023-12-08 ENCOUNTER — Encounter: Payer: Self-pay | Admitting: Family

## 2023-12-08 VITALS — BP 108/73 | HR 69 | Temp 98.2°F | Resp 16 | Ht 63.0 in | Wt 142.0 lb

## 2023-12-08 DIAGNOSIS — Z7689 Persons encountering health services in other specified circumstances: Secondary | ICD-10-CM

## 2023-12-08 DIAGNOSIS — Z6825 Body mass index (BMI) 25.0-25.9, adult: Secondary | ICD-10-CM

## 2023-12-08 DIAGNOSIS — H00014 Hordeolum externum left upper eyelid: Secondary | ICD-10-CM

## 2023-12-08 DIAGNOSIS — R454 Irritability and anger: Secondary | ICD-10-CM

## 2023-12-08 DIAGNOSIS — R21 Rash and other nonspecific skin eruption: Secondary | ICD-10-CM

## 2023-12-08 MED ORDER — MUPIROCIN CALCIUM 2 % EX CREA
1.0000 | TOPICAL_CREAM | Freq: Two times a day (BID) | CUTANEOUS | 2 refills | Status: DC
Start: 2023-12-08 — End: 2023-12-09

## 2023-12-08 MED ORDER — PHENTERMINE HCL 30 MG PO CAPS
30.0000 mg | ORAL_CAPSULE | ORAL | 0 refills | Status: DC
Start: 2023-12-08 — End: 2024-01-16

## 2023-12-08 NOTE — Progress Notes (Signed)
 Patient ID: Kristen Henderson, female    DOB: 1995/12/11  MRN: [id number]  CC: Weight Check   Subjective: Kristen Henderson is a 28 y.o. female who presents for weight check.   Her concerns today include:  - Doing well on Phentermine , no issues/concerns. She watches what she eats. She exercises. She would like to continue with Phentermine  30 mg.  - States she has increased irritability and snappy. States initially she thought symptoms were related to Phentermine  so she quit taking for several days and symptoms persisted. She would like referral to therapist. She denies thoughts of self-harm, suicidal ideations, homicidal ideations. - States she had an ingrown eyelash left upper eyelid and she plucked it out then developed a stye. She denies red flag symptoms.  - States rash bilateral underarms and at bikini line persisting for months. Denies red flag symptoms. States she doesn't shave but sometimes goes to a waxing specialist. States the waxing specialist told her that her symptom is unrelated to waxing.   Patient Active Problem List   Diagnosis Date Noted   Acute cholecystitis 09/27/2022     Current Outpatient Medications on File Prior to Visit  Medication Sig Dispense Refill   acetaminophen  (TYLENOL ) 500 MG tablet Take 500 mg by mouth every 6 (six) hours as needed.     triamcinolone  (KENALOG ) 0.025 % cream Apply 1 Application topically 2 (two) times daily. (Patient taking differently: Apply 1 Application topically 2 (two) times daily.) 60 g 2   ZYRTEC  ALLERGY 10 MG tablet Take 10 mg by mouth daily as needed for allergies or rhinitis.     acetaminophen  (TYLENOL ) 500 MG tablet Take 2 tablets (1,000 mg total) by mouth every 6 (six) hours as needed. 30 tablet 0   AZO-TABS 95 MG tablet Take 95 mg by mouth 3 (three) times daily as needed for pain.     bismuth subsalicylate (PEPTO BISMOL) 262 MG/15ML suspension Take 30 mLs by mouth every 6 (six) hours as needed for  indigestion or diarrhea or loose stools.     calcium  carbonate (TUMS EX) 750 MG chewable tablet Chew 1 tablet by mouth 3 (three) times daily as needed for heartburn.     fluticasone  (FLONASE ) 50 MCG/ACT nasal spray Place 2 sprays into both nostrils daily. (Patient taking differently: Place 2 sprays into both nostrils daily as needed for allergies or rhinitis.) 16 g 11   Ibuprofen  200 MG CAPS Take 400 mg by mouth every 6 (six) hours as needed (for headaches or pain).     OnabotulinumtoxinA (BOTOX IJ) Inject as directed See admin instructions. Botox injections- As directed every 3-4 months for migraines     oxyCODONE  (OXY IR/ROXICODONE ) 5 MG immediate release tablet Take 1 tablet (5 mg total) by mouth every 4 (four) hours as needed for moderate pain. 15 tablet 0   No current facility-administered medications on file prior to visit.    Allergies  Allergen Reactions   Macrobid [Nitrofurantoin] Nausea Only    Social History   Socioeconomic History   Marital status: Single    Spouse name: Not on file   Number of children: Not on file   Years of education: Not on file   Highest education level: Not on file  Occupational History   Not on file  Tobacco Use   Smoking status: Never   Smokeless tobacco: Never  Vaping Use   Vaping status: Never Used  Substance and Sexual Activity   Alcohol use: Yes    Comment: Occa  Drug use: Never   Sexual activity: Yes    Birth control/protection: Condom  Other Topics Concern   Not on file  Social History Narrative   Not on file   Social Drivers of Health   Financial Resource Strain: Low Risk  (05/30/2023)   Overall Financial Resource Strain (CARDIA)    Difficulty of Paying Living Expenses: Not hard at all  Food Insecurity: No Food Insecurity (09/27/2022)   Hunger Vital Sign    Worried About Running Out of Food in the Last Year: Never true    Ran Out of Food in the Last Year: Never true  Transportation Needs: No Transportation Needs (09/27/2022)    PRAPARE - Administrator, Civil Service (Medical): No    Lack of Transportation (Non-Medical): No  Physical Activity: Sufficiently Active (05/30/2023)   Exercise Vital Sign    Days of Exercise per Week: 3 days    Minutes of Exercise per Session: 60 min  Stress: Stress Concern Present (05/30/2023)   [employer] of Occupational Health - Occupational Stress Questionnaire    Feeling of Stress : To some extent  Social Connections: Moderately Isolated (05/30/2023)   Social Connection and Isolation Panel    Frequency of Communication with Friends and Family: More than three times a week    Frequency of Social Gatherings with Friends and Family: Once a week    Attends Religious Services: Never    Database administrator or Organizations: No    Attends Banker Meetings: Never    Marital Status: Living with partner  Intimate Partner Violence: Not At Risk (09/27/2022)   Humiliation, Afraid, Rape, and Kick questionnaire    Fear of Current or Ex-Partner: No    Emotionally Abused: No    Physically Abused: No    Sexually Abused: No    Family History  Problem Relation Age of Onset   Allergic rhinitis Brother    Asthma Brother    Eczema Brother    Diabetes Paternal Grandmother     Past Surgical History:  Procedure Laterality Date   CHOLECYSTECTOMY N/A 09/28/2022   Procedure: LAPAROSCOPIC CHOLECYSTECTOMY WITH INTRAOPERATIVE CHOLANGIOGRAM;  Surgeon: Eletha Boas, MD;  Location: [facility];  Service: General;  Laterality: N/A;    ROS: Review of Systems Negative except as stated above  PHYSICAL EXAM: BP 108/73   Pulse 69   Temp 98.2 F (36.8 C) (Oral)   Resp 16   Ht 5' 3 (1.6 m)   Wt 142 lb (64.4 kg)   LMP 12/05/2023 (Exact Date)   SpO2 99%   BMI 25.15 kg/m   Wt Readings from Last 3 Encounters:  12/08/23 142 lb (64.4 kg)  11/07/23 142 lb 9.6 oz (64.7 kg)  10/03/23 147 lb (66.7 kg)   Physical Exam HENT:     Head: Normocephalic and atraumatic.      Nose: Nose normal.     Mouth/Throat:     Mouth: Mucous membranes are moist.     Pharynx: Oropharynx is clear.  Eyes:     General:        Left eye: Hordeolum present.    Extraocular Movements: Extraocular movements intact.     Conjunctiva/sclera: Conjunctivae normal.     Pupils: Pupils are equal, round, and reactive to light.     Comments: Left upper eyelid hordeolum, no drainage.  Cardiovascular:     Rate and Rhythm: Normal rate and regular rhythm.     Pulses: Normal pulses.     Heart  sounds: Normal heart sounds.  Pulmonary:     Effort: Pulmonary effort is normal.     Breath sounds: Normal breath sounds.  Musculoskeletal:        General: Normal range of motion.     Cervical back: Normal range of motion and neck supple.  Skin:    General: Skin is warm and dry.     Findings: Rash present.     Comments: Rash bilateral underarms, no drainage.  Neurological:     General: No focal deficit present.     Mental Status: She is alert and oriented to person, place, and time.  Psychiatric:        Mood and Affect: Mood normal.        Behavior: Behavior normal.     ASSESSMENT AND PLAN: 1. Encounter for weight management (Primary) 2. BMI 25.0-25.9,adult - Patient same weight as previous office visit.  - Continue Phentermine  as prescribed. Counseled on medication adherence/adverse effects.  - Follow-up with primary provider in 4 weeks or sooner if needed.  - phentermine  30 MG capsule; Take 1 capsule (30 mg total) by mouth every morning.  Dispense: 30 capsule; Refill: 0  3. Irritability - Patient denies thoughts of self-harm, suicidal ideations, homicidal ideations. - Referral to Psychiatry for evaluation/management. - Ambulatory referral to Psychiatry  4. Hordeolum externum of left upper eyelid - Discussed conservative measures with patient.  - Follow-up with primary provider as scheduled.  5. Rash and nonspecific skin eruption - Mupirocin  as prescribed. Counseled on medication  adherence/adverse effects. - Referral to Dermatology for evaluation/management. - Follow-up with primary provider as scheduled. - mupirocin  cream (BACTROBAN ) 2 %; Apply 1 Application topically 2 (two) times daily.  Dispense: 30 g; Refill: 2 - Ambulatory referral to Dermatology   Patient was given the opportunity to ask questions.  Patient verbalized understanding of the plan and was able to repeat key elements of the plan. Patient was given clear instructions to go to Emergency Department or return to medical center if symptoms don't improve, worsen, or new problems develop.The patient verbalized understanding.   Orders Placed This Encounter  Procedures   Ambulatory referral to Dermatology   Ambulatory referral to Psychiatry     Requested Prescriptions   Signed Prescriptions Disp Refills   phentermine  30 MG capsule 30 capsule 0    Sig: Take 1 capsule (30 mg total) by mouth every morning.   mupirocin  cream (BACTROBAN ) 2 % 30 g 2    Sig: Apply 1 Application topically 2 (two) times daily.    Return in about 4 weeks (around 01/05/2024) for Follow-Up or next available weight check.  Greig JINNY Drones, NP

## 2023-12-09 ENCOUNTER — Other Ambulatory Visit: Payer: Self-pay | Admitting: Pharmacist

## 2023-12-09 MED ORDER — MUPIROCIN 2 % EX OINT: 1.0000 | TOPICAL_OINTMENT | Freq: Two times a day (BID) | CUTANEOUS | 2 refills | Status: AC

## 2024-01-16 ENCOUNTER — Encounter: Payer: Self-pay | Admitting: Family

## 2024-01-16 ENCOUNTER — Ambulatory Visit: Admitting: Family

## 2024-01-16 VITALS — BP 116/78 | HR 91 | Temp 98.0°F | Resp 16 | Ht 63.0 in | Wt 136.2 lb

## 2024-01-16 DIAGNOSIS — Z6824 Body mass index (BMI) 24.0-24.9, adult: Secondary | ICD-10-CM | POA: Diagnosis not present

## 2024-01-16 DIAGNOSIS — Z760 Encounter for issue of repeat prescription: Secondary | ICD-10-CM | POA: Diagnosis not present

## 2024-01-16 DIAGNOSIS — Z7689 Persons encountering health services in other specified circumstances: Secondary | ICD-10-CM

## 2024-01-16 MED ORDER — PHENTERMINE HCL 30 MG PO CAPS
30.0000 mg | ORAL_CAPSULE | ORAL | 0 refills | Status: AC
Start: 2024-01-16 — End: ?

## 2024-01-16 NOTE — Progress Notes (Signed)
 Patient ID: Kristen Henderson, female    DOB: [birth year]  MRN: [id number]  CC: Weight Check  Subjective: Kristen Henderson is a 28 y.o. female who presents for weight check.  Her concerns today include:  Doing well on Phentermine , no issues/concerns. She prefers to remain on current dose of Phentermine  30 mg.  Patient Active Problem List   Diagnosis Date Noted   Acute cholecystitis 09/27/2022     Current Outpatient Medications on File Prior to Visit  Medication Sig Dispense Refill   acetaminophen  (TYLENOL ) 500 MG tablet Take 500 mg by mouth every 6 (six) hours as needed.     mupirocin  ointment (BACTROBAN ) 2 % Apply 1 Application topically 2 (two) times daily. 22 g 2   ZYRTEC  ALLERGY 10 MG tablet Take 10 mg by mouth daily as needed for allergies or rhinitis.     triamcinolone  (KENALOG ) 0.025 % cream Apply 1 Application topically 2 (two) times daily. (Patient taking differently: Apply 1 Application topically 2 (two) times daily.) 60 g 2   No current facility-administered medications on file prior to visit.    Allergies  Allergen Reactions   Macrobid [Nitrofurantoin] Nausea Only    Social History   Socioeconomic History   Marital status: Single    Spouse name: Not on file   Number of children: Not on file   Years of education: Not on file   Highest education level: Not on file  Occupational History   Not on file  Tobacco Use   Smoking status: Never   Smokeless tobacco: Never  Vaping Use   Vaping status: Never Used  Substance and Sexual Activity   Alcohol use: Yes    Comment: Occa   Drug use: Never   Sexual activity: Yes    Birth control/protection: Condom  Other Topics Concern   Not on file  Social History Narrative   Not on file   Social Drivers of Health   Financial Resource Strain: Low Risk  (05/30/2023)   Overall Financial Resource Strain (CARDIA)    Difficulty of Paying Living Expenses: Not hard at all  Food Insecurity: No Food Insecurity  (09/27/2022)   Hunger Vital Sign    Worried About Running Out of Food in the Last Year: Never true    Ran Out of Food in the Last Year: Never true  Transportation Needs: No Transportation Needs (09/27/2022)   PRAPARE - Administrator, Civil Service (Medical): No    Lack of Transportation (Non-Medical): No  Physical Activity: Sufficiently Active (05/30/2023)   Exercise Vital Sign    Days of Exercise per Week: 3 days    Minutes of Exercise per Session: 60 min  Stress: Stress Concern Present (05/30/2023)   [employer] of Occupational Health - Occupational Stress Questionnaire    Feeling of Stress : To some extent  Social Connections: Moderately Isolated (05/30/2023)   Social Connection and Isolation Panel    Frequency of Communication with Friends and Family: More than three times a week    Frequency of Social Gatherings with Friends and Family: Once a week    Attends Religious Services: Never    Database administrator or Organizations: No    Attends Banker Meetings: Never    Marital Status: Living with partner  Intimate Partner Violence: Not At Risk (09/27/2022)   Humiliation, Afraid, Rape, and Kick questionnaire    Fear of Current or Ex-Partner: No    Emotionally Abused: No    Physically Abused:  No    Sexually Abused: No    Family History  Problem Relation Age of Onset   Allergic rhinitis Brother    Asthma Brother    Eczema Brother    Diabetes Paternal Grandmother     Past Surgical History:  Procedure Laterality Date   CHOLECYSTECTOMY N/A 09/28/2022   Procedure: LAPAROSCOPIC CHOLECYSTECTOMY WITH INTRAOPERATIVE CHOLANGIOGRAM;  Surgeon: Eletha Boas, MD;  Location: [facility];  Service: General;  Laterality: N/A;    ROS: Review of Systems Negative except as stated above  PHYSICAL EXAM: BP 116/78   Pulse 91   Temp 98 F (36.7 C) (Oral)   Resp 16   Ht 5' 3 (1.6 m)   Wt 136 lb 3.2 oz (61.8 kg)   LMP 12/31/2023 (Exact Date)   SpO2 98%   BMI  24.13 kg/m   Wt Readings from Last 3 Encounters:  01/16/24 136 lb 3.2 oz (61.8 kg)  12/08/23 142 lb (64.4 kg)  11/07/23 142 lb 9.6 oz (64.7 kg)   Physical Exam HENT:     Head: Normocephalic and atraumatic.     Nose: Nose normal.     Mouth/Throat:     Mouth: Mucous membranes are moist.     Pharynx: Oropharynx is clear.  Eyes:     Extraocular Movements: Extraocular movements intact.     Conjunctiva/sclera: Conjunctivae normal.     Pupils: Pupils are equal, round, and reactive to light.  Cardiovascular:     Rate and Rhythm: Normal rate and regular rhythm.     Pulses: Normal pulses.     Heart sounds: Normal heart sounds.  Pulmonary:     Effort: Pulmonary effort is normal.     Breath sounds: Normal breath sounds.  Musculoskeletal:        General: Normal range of motion.     Cervical back: Normal range of motion and neck supple.  Neurological:     General: No focal deficit present.     Mental Status: She is alert and oriented to person, place, and time.  Psychiatric:        Mood and Affect: Mood normal.        Behavior: Behavior normal.     ASSESSMENT AND PLAN: 1. Encounter for weight management (Primary) 2. BMI 24.0-24.9, adult - Patient lost 6 pounds since previous office visit.  - Continue Phentermine  as prescribed. Counseled on medication adherence/adverse effects.  - Follow-up with primary provider in 4 weeks or sooner if needed.  - phentermine  30 MG capsule; Take 1 capsule (30 mg total) by mouth every morning.  Dispense: 30 capsule; Refill: 0   Patient was given the opportunity to ask questions.  Patient verbalized understanding of the plan and was able to repeat key elements of the plan. Patient was given clear instructions to go to Emergency Department or return to medical center if symptoms don't improve, worsen, or new problems develop.The patient verbalized understanding.   No orders of the defined types were placed in this encounter.    Requested  Prescriptions   Signed Prescriptions Disp Refills   phentermine  30 MG capsule 30 capsule 0    Sig: Take 1 capsule (30 mg total) by mouth every morning.    Return in 4 weeks (on 02/13/2024) for Follow-Up or next available weight check .  Greig JINNY Drones, NP

## 2024-01-16 NOTE — Progress Notes (Signed)
 Weight check

## 2024-02-17 ENCOUNTER — Ambulatory Visit: Admitting: Family

## 2024-02-17 ENCOUNTER — Encounter: Payer: Self-pay | Admitting: Family

## 2024-02-17 VITALS — BP 118/79 | HR 61 | Temp 98.9°F | Resp 16 | Ht 63.0 in | Wt 141.4 lb

## 2024-02-17 DIAGNOSIS — R635 Abnormal weight gain: Secondary | ICD-10-CM

## 2024-02-17 NOTE — Progress Notes (Signed)
 Patient ID: Kristen Henderson, female    DOB: Sep 03, 1995  MRN: [id number]  CC: Weight Check  Subjective: Kristen Henderson is a 27 y.o. female who presents for weight check.   Her concerns today include:  States since previous office visit she did not take Phentermine  due to pharmacy told her it was too soon to refill.  Patient Active Problem List   Diagnosis Date Noted   Acute cholecystitis 09/27/2022     Current Outpatient Medications on File Prior to Visit  Medication Sig Dispense Refill   acetaminophen  (TYLENOL ) 500 MG tablet Take 500 mg by mouth every 6 (six) hours as needed.     mupirocin  ointment (BACTROBAN ) 2 % Apply 1 Application topically 2 (two) times daily. 22 g 2   phentermine  30 MG capsule Take 1 capsule (30 mg total) by mouth every morning. 30 capsule 0   triamcinolone  (KENALOG ) 0.025 % cream Apply 1 Application topically 2 (two) times daily. (Patient taking differently: Apply 1 Application topically 2 (two) times daily.) 60 g 2   ZYRTEC  ALLERGY 10 MG tablet Take 10 mg by mouth daily as needed for allergies or rhinitis.     No current facility-administered medications on file prior to visit.    Allergies  Allergen Reactions   Macrobid [Nitrofurantoin] Nausea Only    Social History   Socioeconomic History   Marital status: Single    Spouse name: Not on file   Number of children: Not on file   Years of education: Not on file   Highest education level: Not on file  Occupational History   Not on file  Tobacco Use   Smoking status: Never   Smokeless tobacco: Never  Vaping Use   Vaping status: Never Used  Substance and Sexual Activity   Alcohol use: Yes    Comment: Occa   Drug use: Never   Sexual activity: Yes    Birth control/protection: Condom  Other Topics Concern   Not on file  Social History Narrative   Not on file   Social Drivers of Health   Financial Resource Strain: Low Risk  (05/30/2023)   Overall Financial Resource Strain  (CARDIA)    Difficulty of Paying Living Expenses: Not hard at all  Food Insecurity: No Food Insecurity (09/27/2022)   Hunger Vital Sign    Worried About Running Out of Food in the Last Year: Never true    Ran Out of Food in the Last Year: Never true  Transportation Needs: No Transportation Needs (09/27/2022)   PRAPARE - Administrator, Civil Service (Medical): No    Lack of Transportation (Non-Medical): No  Physical Activity: Sufficiently Active (05/30/2023)   Exercise Vital Sign    Days of Exercise per Week: 3 days    Minutes of Exercise per Session: 60 min  Stress: Stress Concern Present (05/30/2023)   [employer] of Occupational Health - Occupational Stress Questionnaire    Feeling of Stress : To some extent  Social Connections: Moderately Isolated (05/30/2023)   Social Connection and Isolation Panel    Frequency of Communication with Friends and Family: More than three times a week    Frequency of Social Gatherings with Friends and Family: Once a week    Attends Religious Services: Never    Database Administrator or Organizations: No    Attends Banker Meetings: Never    Marital Status: Living with partner  Intimate Partner Violence: Not At Risk (09/27/2022)   Humiliation, Afraid,  Rape, and Kick questionnaire    Fear of Current or Ex-Partner: No    Emotionally Abused: No    Physically Abused: No    Sexually Abused: No    Family History  Problem Relation Age of Onset   Allergic rhinitis Brother    Asthma Brother    Eczema Brother    Diabetes Paternal Grandmother     Past Surgical History:  Procedure Laterality Date   CHOLECYSTECTOMY N/A 09/28/2022   Procedure: LAPAROSCOPIC CHOLECYSTECTOMY WITH INTRAOPERATIVE CHOLANGIOGRAM;  Surgeon: Eletha Boas, MD;  Location: [facility];  Service: General;  Laterality: N/A;    ROS: Review of Systems Negative except as stated above  PHYSICAL EXAM: BP 118/79   Pulse 61   Temp 98.9 F (37.2 C) (Oral)   Resp  16   Ht 5' 3 (1.6 m)   Wt 141 lb 6.4 oz (64.1 kg)   LMP 01/28/2024 (Exact Date)   SpO2 99%   BMI 25.05 kg/m   Wt Readings from Last 3 Encounters:  02/17/24 141 lb 6.4 oz (64.1 kg)  01/16/24 136 lb 3.2 oz (61.8 kg)  12/08/23 142 lb (64.4 kg)    Physical Exam HENT:     Head: Normocephalic and atraumatic.     Nose: Nose normal.     Mouth/Throat:     Mouth: Mucous membranes are moist.     Pharynx: Oropharynx is clear.  Eyes:     Extraocular Movements: Extraocular movements intact.     Conjunctiva/sclera: Conjunctivae normal.     Pupils: Pupils are equal, round, and reactive to light.  Cardiovascular:     Rate and Rhythm: Normal rate and regular rhythm.     Pulses: Normal pulses.     Heart sounds: Normal heart sounds.  Pulmonary:     Effort: Pulmonary effort is normal.     Breath sounds: Normal breath sounds.  Musculoskeletal:        General: Normal range of motion.     Cervical back: Normal range of motion and neck supple.  Neurological:     General: No focal deficit present.     Mental Status: She is alert and oriented to person, place, and time.  Psychiatric:        Mood and Affect: Mood normal.        Behavior: Behavior normal.     ASSESSMENT AND PLAN: 1. Weight gain (Primary) - Patient gained 5 pounds since previous office visit.  - Patient states she does not want to continue Phentermine  presently per her preference.  - Follow-up with primary provider in 4 weeks or sooner if needed.    Patient was given the opportunity to ask questions.  Patient verbalized understanding of the plan and was able to repeat key elements of the plan. Patient was given clear instructions to go to Emergency Department or return to medical center if symptoms don't improve, worsen, or new problems develop.The patient verbalized understanding.   Return in about 4 weeks (around 03/16/2024) for Follow-Up or next available weight check .  Greig JINNY Drones, NP

## 2024-04-09 ENCOUNTER — Encounter: Admitting: Family

## 2024-04-09 NOTE — Progress Notes (Signed)
 Erroneous encounter-disregard

## 2024-05-16 ENCOUNTER — Encounter: Payer: Self-pay | Admitting: Family

## 2024-05-16 ENCOUNTER — Ambulatory Visit: Admitting: Family

## 2024-05-16 VITALS — BP 106/69 | HR 75 | Ht 63.0 in | Wt 137.6 lb

## 2024-05-16 DIAGNOSIS — R635 Abnormal weight gain: Secondary | ICD-10-CM

## 2024-05-16 DIAGNOSIS — F32A Depression, unspecified: Secondary | ICD-10-CM

## 2024-05-16 DIAGNOSIS — R229 Localized swelling, mass and lump, unspecified: Secondary | ICD-10-CM

## 2024-05-16 DIAGNOSIS — F419 Anxiety disorder, unspecified: Secondary | ICD-10-CM | POA: Diagnosis not present

## 2024-05-16 DIAGNOSIS — Z124 Encounter for screening for malignant neoplasm of cervix: Secondary | ICD-10-CM

## 2024-05-16 MED ORDER — ESCITALOPRAM OXALATE 5 MG PO TABS: 5.0000 mg | ORAL_TABLET | Freq: Every day | ORAL | 0 refills | Status: AC

## 2024-05-16 NOTE — Progress Notes (Signed)
 "   Patient ID: Kristen Henderson, female    DOB: [birth year]  MRN: [id number]  CC: Follow-Up  Subjective: Kristen Henderson is a 29 y.o. female who presents for follow-up.  Her concerns today include:  - Anxiety depression. States she is always thinking. She would like to try medication and referral to therapist to see if this helps. She denies thoughts of self-harm, suicidal ideations, homicidal ideations. - States she has a knot of right side that comes and goes. Denies red flag symptoms. Would like referral to specialist.  - Patient states she has been watching what she eats and exercising.  - Requests referral to Gynecology for routine exam. - Patient states she plans to schedule annual exam for later date.   Patient Active Problem List   Diagnosis Date Noted   Acute cholecystitis 09/27/2022     Medications Ordered Prior to Encounter[1]  Allergies[2]  Social History   Socioeconomic History   Marital status: Single    Spouse name: Not on file   Number of children: Not on file   Years of education: Not on file   Highest education level: Not on file  Occupational History   Not on file  Tobacco Use   Smoking status: Never   Smokeless tobacco: Never  Vaping Use   Vaping status: Never Used  Substance and Sexual Activity   Alcohol use: Yes    Comment: Occa   Drug use: Never   Sexual activity: Yes    Birth control/protection: Condom  Other Topics Concern   Not on file  Social History Narrative   Not on file   Social Drivers of Health   Tobacco Use: Low Risk (05/16/2024)   Patient History    Smoking Tobacco Use: Never    Smokeless Tobacco Use: Never    Passive Exposure: Not on file  Financial Resource Strain: Low Risk (05/30/2023)   Overall Financial Resource Strain (CARDIA)    Difficulty of Paying Living Expenses: Not hard at all  Food Insecurity: No Food Insecurity (09/27/2022)   Hunger Vital Sign    Worried About Running Out of Food in the Last  Year: Never true    Ran Out of Food in the Last Year: Never true  Transportation Needs: No Transportation Needs (09/27/2022)   PRAPARE - Administrator, Civil Service (Medical): No    Lack of Transportation (Non-Medical): No  Physical Activity: Sufficiently Active (05/30/2023)   Exercise Vital Sign    Days of Exercise per Week: 3 days    Minutes of Exercise per Session: 60 min  Stress: Stress Concern Present (05/30/2023)   [employer] of Occupational Health - Occupational Stress Questionnaire    Feeling of Stress : To some extent  Social Connections: Moderately Isolated (05/30/2023)   Social Connection and Isolation Panel    Frequency of Communication with Friends and Family: More than three times a week    Frequency of Social Gatherings with Friends and Family: Once a week    Attends Religious Services: Never    Database Administrator or Organizations: No    Attends Banker Meetings: Never    Marital Status: Living with partner  Intimate Partner Violence: Not At Risk (09/27/2022)   Humiliation, Afraid, Rape, and Kick questionnaire    Fear of Current or Ex-Partner: No    Emotionally Abused: No    Physically Abused: No    Sexually Abused: No  Depression (PHQ2-9): Low Risk (02/17/2024)   Depression (PHQ2-9)  PHQ-2 Score: 0  Alcohol Screen: Low Risk (05/30/2023)   Alcohol Screen    Last Alcohol Screening Score (AUDIT): 1  Housing: Low Risk (09/27/2022)   Housing    Last Housing Risk Score: 0  Utilities: Not At Risk (09/27/2022)   [employer] Utilities    Threatened with loss of utilities: No  Health Literacy: Adequate Health Literacy (05/30/2023)   B1300 Health Literacy    Frequency of need for help with medical instructions: Never    Family History  Problem Relation Age of Onset   Allergic rhinitis Brother    Asthma Brother    Eczema Brother    Diabetes Paternal Grandmother     Past Surgical History:  Procedure Laterality Date   CHOLECYSTECTOMY N/A  09/28/2022   Procedure: LAPAROSCOPIC CHOLECYSTECTOMY WITH INTRAOPERATIVE CHOLANGIOGRAM;  Surgeon: Eletha Boas, MD;  Location: [facility];  Service: General;  Laterality: N/A;    ROS: Review of Systems Negative except as stated above  PHYSICAL EXAM: BP 106/69 (BP Location: Right Arm, Patient Position: Sitting, Cuff Size: Normal)   Pulse 75   Ht 5' 3 (1.6 m)   Wt 137 lb 9.6 oz (62.4 kg)   LMP 05/04/2024 (Within Days)   SpO2 97%   BMI 24.37 kg/m   Wt Readings from Last 3 Encounters:  05/16/24 137 lb 9.6 oz (62.4 kg)  02/17/24 141 lb 6.4 oz (64.1 kg)  01/16/24 136 lb 3.2 oz (61.8 kg)   Physical Exam HENT:     Head: Normocephalic and atraumatic.     Nose: Nose normal.     Mouth/Throat:     Mouth: Mucous membranes are moist.     Pharynx: Oropharynx is clear.  Eyes:     Extraocular Movements: Extraocular movements intact.     Conjunctiva/sclera: Conjunctivae normal.     Pupils: Pupils are equal, round, and reactive to light.  Cardiovascular:     Rate and Rhythm: Normal rate and regular rhythm.     Pulses: Normal pulses.     Heart sounds: Normal heart sounds.  Pulmonary:     Effort: Pulmonary effort is normal.     Breath sounds: Normal breath sounds.  Musculoskeletal:        General: Normal range of motion.     Cervical back: Normal range of motion and neck supple.  Skin:    General: Skin is warm and dry.     Comments: Small firm nodule right abdomen.   Neurological:     General: No focal deficit present.     Mental Status: She is alert and oriented to person, place, and time.  Psychiatric:        Mood and Affect: Mood normal.        Behavior: Behavior normal.     ASSESSMENT AND PLAN: 1. Anxiety and depression (Primary) - Patient denies thoughts of self-harm, suicidal ideations, homicidal ideations. - Escitalopram  as prescribed. Counseled on medication adherence/adverse effects.  - Referral to Psychiatry for evaluation/management.  - Follow-up with primary provider  as scheduled.  - escitalopram  (LEXAPRO ) 5 MG tablet; Take 1 tablet (5 mg total) by mouth daily.  Dispense: 90 tablet; Refill: 0 - Ambulatory referral to Psychiatry  2. Skin nodule - Referral to Dermatology for evaluation/management.  - Ambulatory referral to Dermatology  3. Weight gain - Patient lost 4 pounds since previous office visit.  - Patient states she does not prefer Phentermine  presently.  4. Cervical cancer screening - Referral to Gynecology for evaluation/management.  - Ambulatory referral to Gynecology  Patient was given the opportunity to ask questions.  Patient verbalized understanding of the plan and was able to repeat key elements of the plan. Patient was given clear instructions to go to Emergency Department or return to medical center if symptoms don't improve, worsen, or new problems develop.The patient verbalized understanding.   Orders Placed This Encounter  Procedures   Ambulatory referral to Psychiatry   Ambulatory referral to Dermatology   Ambulatory referral to Gynecology     Requested Prescriptions   Signed Prescriptions Disp Refills   escitalopram  (LEXAPRO ) 5 MG tablet 90 tablet 0    Sig: Take 1 tablet (5 mg total) by mouth daily.    Return for Follow-up as needed.  Greig JINNY Chute, NP      [1]  Current Outpatient Medications on File Prior to Visit  Medication Sig Dispense Refill   acetaminophen  (TYLENOL ) 500 MG tablet Take 500 mg by mouth every 6 (six) hours as needed.     triamcinolone  (KENALOG ) 0.025 % cream Apply 1 Application topically 2 (two) times daily. (Patient taking differently: Apply 1 Application topically as needed.) 60 g 2   mupirocin  ointment (BACTROBAN ) 2 % Apply 1 Application topically 2 (two) times daily. (Patient not taking: Reported on 05/16/2024) 22 g 2   phentermine  30 MG capsule Take 1 capsule (30 mg total) by mouth every morning. (Patient not taking: Reported on 05/16/2024) 30 capsule 0   ZYRTEC  ALLERGY 10 MG tablet Take 10  mg by mouth daily as needed for allergies or rhinitis.     No current facility-administered medications on file prior to visit.  [2]  Allergies Allergen Reactions   Macrobid [Nitrofurantoin] Nausea Only   "

## 2024-07-03 ENCOUNTER — Encounter: Admitting: Family
